# Patient Record
Sex: Female | Born: 1955 | Race: White | Hispanic: No | Marital: Single | Smoking: Former smoker
Health system: Southern US, Community
[De-identification: ages and names within clinical notes are randomized; demographics above are authoritative.]

## PROBLEM LIST (undated history)

## (undated) DIAGNOSIS — D649 Anemia, unspecified: Secondary | ICD-10-CM

## (undated) DIAGNOSIS — I82401 Acute embolism and thrombosis of unspecified deep veins of right lower extremity: Secondary | ICD-10-CM

## (undated) HISTORY — DX: Acute embolism and thrombosis of unspecified deep veins of right lower extremity: I82.401

## (undated) HISTORY — DX: Anemia, unspecified: D64.9

## (undated) HISTORY — PX: APPENDECTOMY: SHX54

---

## 1981-11-25 HISTORY — PX: DEBRIDEMENT AND CLOSURE WOUND: SHX5614

## 2004-09-19 ENCOUNTER — Ambulatory Visit: Payer: Self-pay | Admitting: Family Medicine

## 2009-03-14 ENCOUNTER — Ambulatory Visit: Payer: Self-pay | Admitting: Family Medicine

## 2010-11-25 DIAGNOSIS — I82401 Acute embolism and thrombosis of unspecified deep veins of right lower extremity: Secondary | ICD-10-CM

## 2010-11-25 HISTORY — DX: Acute embolism and thrombosis of unspecified deep veins of right lower extremity: I82.401

## 2011-02-05 ENCOUNTER — Inpatient Hospital Stay: Payer: Self-pay | Admitting: Internal Medicine

## 2011-04-17 ENCOUNTER — Ambulatory Visit: Payer: Self-pay | Admitting: Family Medicine

## 2014-01-13 ENCOUNTER — Emergency Department: Payer: Self-pay | Admitting: Emergency Medicine

## 2014-01-13 LAB — PROTIME-INR
INR: 2.2
PROTHROMBIN TIME: 24 s — AB (ref 11.5–14.7)

## 2014-02-28 ENCOUNTER — Ambulatory Visit: Payer: Self-pay | Admitting: Family Medicine

## 2014-03-24 DIAGNOSIS — Z86718 Personal history of other venous thrombosis and embolism: Secondary | ICD-10-CM | POA: Insufficient documentation

## 2014-08-29 DIAGNOSIS — R7303 Prediabetes: Secondary | ICD-10-CM | POA: Insufficient documentation

## 2016-12-31 NOTE — Progress Notes (Signed)
Hematology/Oncology Consult note [facility] Telephone:([phone]) [phone] [phone]) [phone]  No care team member to display   Name of the patient: Cynthia Shelton  [id number]  1955/11/26    Reason for referral- microcytic anemia   Referring physician- Dr. Burnadette PopLinthavong  Date of visit: 12/31/16   History of presenting illness- patient is a 61 year old female who is known to have chronic microcytic anemia. CBC from 12/24/2016 showed white count of 7, H&H of 7.5/27.9 with an MCV of 54.5 and platelet count of 425. Her ferritin was low at 2 and iron level was low at 12. Back in September 2015 her white count was 6.1, H&H was 11.7/37 with an MCV of 68.8 and a platelet count of 476. Patient has not been evaluated by GI. She has been hesitant to see them as she was concerned about bleeding during endoscopy as she is on coumadin. Patient has a history of DVT in 2012 and currently is on Coumadin. Patient states that her DVT was in her right lower extremity and was extensive. She apparently had genetic testing at that time which was negative. 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 Patient has not started taking oral iron yet. She denies any blood in stools or dark stools. Denies any frequent use of NSAIDS  ECOG PS- 1  Pain scale- 3- chronic neck pain   Review of systems- Review of Systems  Constitutional: Negative for chills, fever, malaise/fatigue and weight loss.  HENT: Negative for congestion, ear discharge and nosebleeds.   Eyes: Negative for blurred vision.  Respiratory: Negative for cough, hemoptysis, sputum production, shortness of breath and wheezing.   Cardiovascular: Negative for chest pain, palpitations, orthopnea and claudication.  Gastrointestinal: Negative for abdominal pain, blood in stool, constipation, diarrhea, heartburn, melena, nausea and vomiting.  Genitourinary: Negative for dysuria, flank pain, frequency, hematuria and urgency.    Musculoskeletal: Positive for joint pain. Negative for back pain and myalgias.  Skin: Negative for rash.  Neurological: Negative for dizziness, tingling, focal weakness, seizures, weakness and headaches.  Endo/Heme/Allergies: Does not bruise/bleed easily.  Psychiatric/Behavioral: Negative for depression and suicidal ideas. The patient does not have insomnia.     Allergies  Allergen Reactions  . Ciprofloxacin Other (See Comments)    Altered mental status  . Diazepam Other (See Comments)    Altered mental status  . Prednisone Other (See Comments)    Severe confusion  . Minocycline Other (See Comments) and Rash    thrush    Patient Active Problem List   Diagnosis Date Noted  . Microcytic anemia 01/02/2017  . DVT of lower extremity (deep venous thrombosis) (HCC) 01/02/2017     Past Medical History:  Diagnosis Date  . Anemia   . Right leg DVT (HCC) 2012     Past Surgical History:  Procedure Laterality Date  . APPENDECTOMY    . DEBRIDEMENT AND CLOSURE WOUND  1983    Social History   Social History  . Marital status: Unknown    Spouse name: N/A  . Number of children: N/A  . Years of education: N/A   Occupational History  . Not on file.   Social History Main Topics  . Smoking status: Current Every Day Smoker    Packs/day: 0.25    Years: 50.00    Types: Cigarettes  . Smokeless tobacco: Current User     Comment: pt refd info  . Alcohol use Not on file  . Drug use: Unknown  . Sexual activity:  Not on file   Other Topics Concern  . Not on file   Social History Narrative  . No narrative on file     Family History  Problem Relation Age of Onset  . Cirrhosis Mother   . Cancer Father      Current Outpatient Prescriptions:  .  acetaminophen (TYLENOL) 500 MG tablet, Take by mouth., Disp: , Rfl:  .  warfarin (COUMADIN) 5 MG tablet, take 2 tablets by mouth once daily EXCEPT 1 TABLET ON SUNDAYS, Disp: , Rfl: 0   Physical exam:  Vitals:   01/02/17 1357   BP: 138/89  Pulse: 84  Resp: 20  Temp: 98.7 F (37.1 C)  TempSrc: Tympanic  Weight: 233 lb 14.5 oz (106.1 kg)  Height: 5' 3.58" (1.615 m)   Physical Exam  Constitutional: She is oriented to person, place, and time.  Patient is obese, appears in no acute distress  HENT:  Head: Normocephalic and atraumatic.  Eyes: EOM are normal. Pupils are equal, round, and reactive to light.  Neck: Normal range of motion.  Cardiovascular: Normal rate, regular rhythm and normal heart sounds.   Pulmonary/Chest: Effort normal and breath sounds normal.  Abdominal: Soft. Bowel sounds are normal.  Neurological: She is alert and oriented to person, place, and time.  Skin: Skin is warm and dry.      Assessment and plan- Patient is a 61 y.o. female who has been referred to [place] for evaluation and management of microcytic anemia  1. Microcytic anemia secondary to iron deficiency. GI blood loss needs to be ruled out at this time. I explained to the patient that she has significant microcytic anemia and there is a concern for occult GI bleeding at this time. She continues to be on Coumadin and at present time her risk of bleeding is higher than the risk of clotting and I would favor holding her Coumadin for 2-3 weeks until she is evaluated by GI and gets endoscopy. Patient is concerned about developing a recurrent DVT or PE if she were to hold Coumadin. I explained to her that the decision to continue indefinite anticoagulation for unprovoked DVT is always the risks versus benefit decision based on risk of bleeding versus risk of clotting. We could consider putting in an interval IVC filter until she gets an endoscopy and hold her Coumadin. Patient understands the risks and benefits of continuing versus stopping Coumadin and would like to continue Coumadin until we have the results of today's blood work back. Today I will be checking a repeat CBC, ferritin, iron studies, B12 folate and reticulocyte count. I will refer  her to gastroenterology for panendoscopy. Patient states that she has to still straighten out her insurance and is concerned about out-of-pocket costs involved with her endoscopy. She is willing to see GI at this time but would like to hold off on endoscopy until she straightens her insurance out which would be fairly soon.  I will call the patient tomorrow with the results of her CBC and iron studies and if she were to be significantly anemic, I would definitely recommend holding Coumadin plus minus IVC filter until GI workup is completed.  Patient does have a higher degree of microcytosis than the level of her anemia. Even in September 2015 when her hemoglobin was 11.7 she was microcytic with an MCV of 68. Patient denies any family history of thalassemia. However I will check hemoglobin electrophoresis to rule out the same  If she were to be significantly anemic I  would favor giving her IV iron over oral iron. Patient would like to try oral iron before she goes for IV iron. She will start taking oral iron 325 mg twice a day and I will see her back in 6 weeks' time with repeat CBC and iron studies   Total face to face encounter time for this patient visit was 30 min. >50% of the time was  spent in counseling and coordination of care.    Thank you for this kind referral and the opportunity to participate in the care of this patient   Visit Diagnosis No diagnosis found.  Dr. Owens Shark, MD, MPH [facility] Pager- [phone] 12/31/2016

## 2017-01-02 ENCOUNTER — Inpatient Hospital Stay: Payer: Self-pay | Attending: Oncology | Admitting: Oncology

## 2017-01-02 ENCOUNTER — Encounter: Payer: Self-pay | Admitting: Oncology

## 2017-01-02 ENCOUNTER — Ambulatory Visit: Payer: Self-pay

## 2017-01-02 ENCOUNTER — Telehealth: Payer: Self-pay | Admitting: *Deleted

## 2017-01-02 ENCOUNTER — Encounter (INDEPENDENT_AMBULATORY_CARE_PROVIDER_SITE_OTHER): Payer: Self-pay

## 2017-01-02 VITALS — BP 138/89 | HR 84 | Temp 98.7°F | Resp 20 | Ht 63.58 in | Wt 233.9 lb

## 2017-01-02 DIAGNOSIS — F17210 Nicotine dependence, cigarettes, uncomplicated: Secondary | ICD-10-CM | POA: Insufficient documentation

## 2017-01-02 DIAGNOSIS — Z86718 Personal history of other venous thrombosis and embolism: Secondary | ICD-10-CM | POA: Insufficient documentation

## 2017-01-02 DIAGNOSIS — D509 Iron deficiency anemia, unspecified: Secondary | ICD-10-CM | POA: Insufficient documentation

## 2017-01-02 DIAGNOSIS — Z79899 Other long term (current) drug therapy: Secondary | ICD-10-CM | POA: Insufficient documentation

## 2017-01-02 DIAGNOSIS — Z7901 Long term (current) use of anticoagulants: Secondary | ICD-10-CM | POA: Insufficient documentation

## 2017-01-02 DIAGNOSIS — I825Y1 Chronic embolism and thrombosis of unspecified deep veins of right proximal lower extremity: Secondary | ICD-10-CM

## 2017-01-02 DIAGNOSIS — I82409 Acute embolism and thrombosis of unspecified deep veins of unspecified lower extremity: Secondary | ICD-10-CM | POA: Insufficient documentation

## 2017-01-02 LAB — CBC
HEMATOCRIT: 26.8 % — AB (ref 35.0–47.0)
HEMOGLOBIN: 7.7 g/dL — AB (ref 12.0–16.0)
MCH: 14.3 pg — AB (ref 26.0–34.0)
MCHC: 28.7 g/dL — AB (ref 32.0–36.0)
MCV: 49.9 fL — AB (ref 80.0–100.0)
Platelets: 409 10*3/uL (ref 150–440)
RBC: 5.38 MIL/uL — ABNORMAL HIGH (ref 3.80–5.20)
RDW: 22.2 % — AB (ref 11.5–14.5)
WBC: 7.2 10*3/uL (ref 3.6–11.0)

## 2017-01-02 LAB — IRON AND TIBC
IRON: 13 ug/dL — AB (ref 28–170)
SATURATION RATIOS: 2 % — AB (ref 10.4–31.8)
TIBC: 598 ug/dL — AB (ref 250–450)
UIBC: 585 ug/dL

## 2017-01-02 LAB — VITAMIN B12: VITAMIN B 12: 250 pg/mL (ref 180–914)

## 2017-01-02 LAB — FOLATE: Folate: 10.7 ng/mL (ref >5.9)

## 2017-01-02 LAB — RETICULOCYTES
RBC.: 5.38 MIL/uL — ABNORMAL HIGH (ref 3.80–5.20)
Retic Count, Absolute: 80.7 10*3/uL (ref 19.0–183.0)
Retic Ct Pct: 1.5 % (ref 0.4–3.1)

## 2017-01-02 LAB — FERRITIN: Ferritin: 2 ng/mL — ABNORMAL LOW (ref 11–307)

## 2017-01-02 NOTE — Telephone Encounter (Signed)
Pt contacted about her GI appt for March 15 at 9:30. Patient is agreeable to the appt. I did tell pt that her hgb 7.7, and ferritin was 2.  I told pt that based on levels that dr Smith Robertrao would rec: feraheme.  She states that she does not want IV iron. She is starting iron pills today and will take 1 tablet bid.  I told her if she changes her mind she can call and if not we will see her back and her next appt. I did tell her that I will let md know about gi appt not til march and pt wants to wait til then to see if ferritin gets better and if she wants to pursue this work up with gi

## 2017-01-02 NOTE — Progress Notes (Signed)
Pt here as new pt. Feeling very fatigued. C/o muscle aches that awaken her also from night .

## 2017-01-03 ENCOUNTER — Telehealth: Payer: Self-pay | Admitting: *Deleted

## 2017-01-03 NOTE — Telephone Encounter (Signed)
[facility]. She states she is having blood work in next 2 weeks and Dr. Smith Robertao wanted to add a cbc on because pt does not want to come to 2 different places. I asked if we could add it on and what I need to do. Nate states that he will send message to MD and if they won't do it they will call me. Otherwise it will be done and the labs is later this month.

## 2017-01-05 ENCOUNTER — Other Ambulatory Visit: Payer: Self-pay | Admitting: *Deleted

## 2017-01-05 DIAGNOSIS — D509 Iron deficiency anemia, unspecified: Secondary | ICD-10-CM

## 2017-01-06 LAB — HEMOGLOBINOPATHY EVALUATION
HGB A: 98.6 % (ref 96.4–98.8)
HGB C: 0 %
HGB S QUANTITAION: 0 %
Hgb A2 Quant: 1.4 % — ABNORMAL LOW (ref 1.8–3.2)
Hgb F Quant: 0 % (ref 0.0–2.0)
Hgb Variant: 0 %

## 2017-02-13 ENCOUNTER — Ambulatory Visit: Payer: Self-pay | Admitting: Oncology

## 2017-02-13 ENCOUNTER — Other Ambulatory Visit: Payer: Self-pay

## 2017-02-20 ENCOUNTER — Inpatient Hospital Stay: Payer: PRIVATE HEALTH INSURANCE

## 2017-02-20 ENCOUNTER — Inpatient Hospital Stay: Payer: PRIVATE HEALTH INSURANCE | Attending: Oncology | Admitting: Oncology

## 2017-02-20 ENCOUNTER — Telehealth: Payer: Self-pay | Admitting: Oncology

## 2017-02-20 ENCOUNTER — Other Ambulatory Visit: Payer: Self-pay | Admitting: Oncology

## 2017-02-20 VITALS — BP 140/81 | HR 81 | Temp 95.0°F | Resp 18 | Wt 235.3 lb

## 2017-02-20 DIAGNOSIS — D509 Iron deficiency anemia, unspecified: Secondary | ICD-10-CM

## 2017-02-20 DIAGNOSIS — Z7901 Long term (current) use of anticoagulants: Secondary | ICD-10-CM | POA: Insufficient documentation

## 2017-02-20 DIAGNOSIS — Z86718 Personal history of other venous thrombosis and embolism: Secondary | ICD-10-CM | POA: Diagnosis not present

## 2017-02-20 DIAGNOSIS — F17210 Nicotine dependence, cigarettes, uncomplicated: Secondary | ICD-10-CM

## 2017-02-20 DIAGNOSIS — Z79899 Other long term (current) drug therapy: Secondary | ICD-10-CM | POA: Diagnosis not present

## 2017-02-20 LAB — CBC
HCT: 28.1 % — ABNORMAL LOW (ref 35.0–47.0)
Hemoglobin: 8 g/dL — ABNORMAL LOW (ref 12.0–16.0)
MCH: 14.8 pg — AB (ref 26.0–34.0)
MCHC: 28.3 g/dL — AB (ref 32.0–36.0)
MCV: 52.1 fL — AB (ref 80.0–100.0)
PLATELETS: 383 10*3/uL (ref 150–440)
RBC: 5.4 MIL/uL — ABNORMAL HIGH (ref 3.80–5.20)
RDW: 23.6 % — AB (ref 11.5–14.5)
WBC: 6.3 10*3/uL (ref 3.6–11.0)

## 2017-02-20 LAB — FERRITIN: Ferritin: 1 ng/mL — ABNORMAL LOW (ref 11–307)

## 2017-02-20 LAB — IRON AND TIBC
Iron: 15 ug/dL — ABNORMAL LOW (ref 28–170)
Saturation Ratios: 3 % — ABNORMAL LOW (ref 10.4–31.8)
TIBC: 582 ug/dL — AB (ref 250–450)
UIBC: 567 ug/dL

## 2017-02-20 NOTE — Telephone Encounter (Signed)
Spoke with patient about iron and ferritin lab results. During office visit today, patient did not want IV iron and wanted to continue her oral iron BID. She wanted to wait until her granddaughter visited in late May. After giving results, she is now agreeable for two doses of IV iron.  We need pre-auth for iron transfusions and to schedule an appt for her to get her transfusions.   Thanks....Mauro KaufmannJennifer E Burns

## 2017-02-20 NOTE — Progress Notes (Signed)
Here for follow up. c/o constipation w iron x 10 d per pt.

## 2017-02-20 NOTE — Progress Notes (Signed)
Hematology/Oncology Consult note [facility]  Telephone:([phone] Fax:([phone]) [phone]  Patient Care Team: Dion Body, MD as PCP - General (Family Medicine)   Name of the patient: Cynthia Shelton  [id number]  1956-09-10   Date of visit: 02/20/17  Diagnosis- iron deficiency anemia. GI evaluation pending  Chief complaint/ Reason for visit- routine f/u  Heme/Onc history:  patient is a 61 year old female who is known to have chronic microcytic anemia. CBC from 12/24/2016 showed white count of 7, H&H of 7.5/27.9 with an MCV of 54.5 and platelet count of 425. Her ferritin was low at 2 and iron level was low at 12. Back in September 2015 her white count was 6.1, H&H was 11.7/37 with an MCV of 68.8 and a platelet count of 476. Patient has not been evaluated by GI. She has been hesitant to see them as she was concerned about bleeding during endoscopy as she is on coumadin. Patient has a history of DVT in 2012 and currently is on Coumadin. Patient states that her DVT was in her right lower extremity and was extensive. She apparently had genetic testing at that time which was negative. 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 Patient has not started taking oral iron yet. She denies any blood in stools or dark stools. Denies any frequent use of NSAIDS  Patient has refused IV iron in the past and has not followed up with gastroenterology yet   Interval history- report she is taking 1 dose of oral iron but has not been able to take 2 doses of oral iron due to constipation. She continues to be hesitant to try IV iron. She did touch base with [facility] but did not see them yet for possible endoscopy. Her granddaughter is visiting her in May and patient does not want to go through any endoscopy or colonoscopy until such time  ECOG PS- 1   Review of systems- Review of Systems  Constitutional: Negative for chills, fever, malaise/fatigue and  weight loss.  HENT: Negative for congestion, ear discharge and nosebleeds.   Eyes: Negative for blurred vision.  Respiratory: Negative for cough, hemoptysis, sputum production, shortness of breath and wheezing.   Cardiovascular: Negative for chest pain, palpitations, orthopnea and claudication.  Gastrointestinal: Negative for abdominal pain, blood in stool, constipation, diarrhea, heartburn, melena, nausea and vomiting.  Genitourinary: Negative for dysuria, flank pain, frequency, hematuria and urgency.  Musculoskeletal: Negative for back pain, joint pain and myalgias.  Skin: Negative for rash.  Neurological: Negative for dizziness, tingling, focal weakness, seizures, weakness and headaches.  Endo/Heme/Allergies: Does not bruise/bleed easily.  Psychiatric/Behavioral: Negative for depression and suicidal ideas. The patient does not have insomnia.      Current treatment- oral iron  Allergies  Allergen Reactions  . Ciprofloxacin Other (See Comments)    Altered mental status  . Diazepam Other (See Comments)    Altered mental status  . Prednisone Other (See Comments)    Severe confusion  . Minocycline Other (See Comments) and Rash    thrush     Past Medical History:  Diagnosis Date  . Anemia   . Right leg DVT ([place]) 2012     Past Surgical History:  Procedure Laterality Date  . APPENDECTOMY    . [place]    Social History   Social History  . Marital status: Unknown    Spouse name: N/A  . Number of children: N/A  . Years of  education: N/A   Occupational History  . Not on file.   Social History Main Topics  . Smoking status: Current Every Day Smoker    Packs/day: 0.25    Years: 50.00    Types: Cigarettes  . Smokeless tobacco: Current User     Comment: pt refd info  . Alcohol use Not on file  . Drug use: Unknown  . Sexual activity: Not on file   Other Topics Concern  . Not on file   Social History Narrative  . No narrative on file     Family History  Problem Relation Age of Onset  . Cirrhosis Mother   . Cancer Father      Current Outpatient Prescriptions:  .  acetaminophen (TYLENOL) 500 MG tablet, Take by mouth., Disp: , Rfl:  .  ferrous sulfate 325 (65 FE) MG tablet, Take 325 mg by mouth 2 (two) times daily with a meal., Disp: , Rfl:  .  warfarin (COUMADIN) 5 MG tablet, take 2 tablets by mouth once daily EXCEPT 1 TABLET ON SUNDAYS, Disp: , Rfl: 0  Physical exam:  Vitals:   02/20/17 1047  BP: 140/81  Pulse: 81  Resp: 18  Temp: (!) 95 F (35 C)  TempSrc: Tympanic  Weight: 235 lb 4.8 oz (106.7 kg)   Physical Exam  Constitutional: She is oriented to person, place, and time and well-developed, well-nourished, and in no distress.  HENT:  Head: Normocephalic and atraumatic.  Eyes: EOM are normal. Pupils are equal, round, and reactive to light.  Neck: Normal range of motion.  Cardiovascular: Normal rate, regular rhythm and normal heart sounds.   Pulmonary/Chest: Effort normal and breath sounds normal.  Abdominal: Soft. Bowel sounds are normal.  Neurological: She is alert and oriented to person, place, and time.  Skin: Skin is warm and dry.     No flowsheet data found. CBC Latest Ref Rng & Units 02/20/2017  WBC 3.6 - 11.0 K/uL 6.3  Hemoglobin 12.0 - 16.0 g/dL 8.0(L)  Hematocrit 35.0 - 47.0 % 28.1(L)  Platelets 150 - 440 K/uL 383    Assessment and plan- Patient is a 61 y.o. female who was referred to [place] for microcystic anemia  Patient continues to remain significantly anemic despite trial of oral iron and I strongly suggested that patient should try IV iron feraheme 510 mg X 2 doses at this time. Patient continues to be hesitant about trying IV iron and states that she would like to take oral iron twice a day. She is awaiting the results of iron studies from today and if there is persistently low she would be willing to consider IV iron. I did discuss the risks and benefits of IV iron including all but  not limited to risk of infusion reactions. Patient understands and would like to think about it. I also strongly suggested that patient needs to see gastroenterology given her iron deficiency anemia and back oral or IV iron and still does not ascertain the cause of the anemia and only treats it. I will see the patient back in 2 months time with repeat CBC and iron studies area and she will get a repeat CBC checked one month mark. Patient does have significant microcytic anemia and microcytosis has persisted in the past as well even when she was not this anemic. Hemoglobin electrophoresis did not reveal any evidence of thalassemia although alpha thalassemia trait candy mass in the presence of iron deficiency. The cause of this profound microcytosis is uncertain. I would  like to hold off on a bone marrow biopsy at this time  Given that her hemoglobin is low but stable around 8 she can continue with Coumadin at this time   Visit Diagnosis 1. Iron deficiency anemia, unspecified iron deficiency anemia type      Dr. Randa Evens, MD, MPH [facility] Pager- [phone] 02/20/2017 12:02 PM

## 2017-02-20 NOTE — Telephone Encounter (Signed)
Message sent to scheduling to schedule feraheme x2, once at the end of next week and the second one during the following week

## 2017-02-28 ENCOUNTER — Inpatient Hospital Stay: Payer: PRIVATE HEALTH INSURANCE | Attending: Oncology

## 2017-02-28 VITALS — BP 130/81 | HR 79 | Temp 97.5°F | Resp 16

## 2017-02-28 DIAGNOSIS — F17210 Nicotine dependence, cigarettes, uncomplicated: Secondary | ICD-10-CM | POA: Insufficient documentation

## 2017-02-28 DIAGNOSIS — D509 Iron deficiency anemia, unspecified: Secondary | ICD-10-CM | POA: Insufficient documentation

## 2017-02-28 DIAGNOSIS — Z79899 Other long term (current) drug therapy: Secondary | ICD-10-CM | POA: Insufficient documentation

## 2017-02-28 DIAGNOSIS — Z7901 Long term (current) use of anticoagulants: Secondary | ICD-10-CM | POA: Diagnosis not present

## 2017-02-28 DIAGNOSIS — Z86718 Personal history of other venous thrombosis and embolism: Secondary | ICD-10-CM | POA: Insufficient documentation

## 2017-02-28 MED ORDER — SODIUM CHLORIDE 0.9 % IV SOLN
510.0000 mg | Freq: Once | INTRAVENOUS | Status: AC
  Administered 2017-02-28: 510 mg via INTRAVENOUS
  Filled 2017-02-28: qty 17

## 2017-02-28 MED ORDER — SODIUM CHLORIDE 0.9 % IV SOLN
Freq: Once | INTRAVENOUS | Status: AC
  Administered 2017-02-28: 11:00:00 via INTRAVENOUS
  Filled 2017-02-28: qty 1000

## 2017-03-07 ENCOUNTER — Inpatient Hospital Stay: Payer: PRIVATE HEALTH INSURANCE

## 2017-03-07 VITALS — BP 115/81 | HR 70 | Temp 98.2°F | Resp 18

## 2017-03-07 DIAGNOSIS — D509 Iron deficiency anemia, unspecified: Secondary | ICD-10-CM | POA: Diagnosis not present

## 2017-03-07 MED ORDER — SODIUM CHLORIDE 0.9 % IV SOLN
Freq: Once | INTRAVENOUS | Status: AC
  Administered 2017-03-07: 13:00:00 via INTRAVENOUS
  Filled 2017-03-07: qty 1000

## 2017-03-07 MED ORDER — SODIUM CHLORIDE 0.9 % IV SOLN
510.0000 mg | Freq: Once | INTRAVENOUS | Status: AC
  Administered 2017-03-07: 510 mg via INTRAVENOUS
  Filled 2017-03-07: qty 17

## 2017-03-07 NOTE — Patient Instructions (Signed)

## 2017-03-24 ENCOUNTER — Other Ambulatory Visit: Payer: Self-pay

## 2017-03-24 ENCOUNTER — Inpatient Hospital Stay: Payer: PRIVATE HEALTH INSURANCE

## 2017-03-24 ENCOUNTER — Other Ambulatory Visit: Payer: Self-pay | Admitting: *Deleted

## 2017-03-24 DIAGNOSIS — D509 Iron deficiency anemia, unspecified: Secondary | ICD-10-CM

## 2017-03-24 LAB — CBC
HEMATOCRIT: 38.1 % (ref 35.0–47.0)
HEMOGLOBIN: 12.4 g/dL (ref 12.0–16.0)
MCH: 21.2 pg — AB (ref 26.0–34.0)
MCHC: 32.6 g/dL (ref 32.0–36.0)
MCV: 65 fL — ABNORMAL LOW (ref 80.0–100.0)
Platelets: 362 10*3/uL (ref 150–440)
RBC: 5.86 MIL/uL — AB (ref 3.80–5.20)
RDW: 39 % — ABNORMAL HIGH (ref 11.5–14.5)
WBC: 5.5 10*3/uL (ref 3.6–11.0)

## 2017-03-24 LAB — FERRITIN: FERRITIN: 25 ng/mL (ref 11–307)

## 2017-03-24 LAB — IRON AND TIBC
IRON: 44 ug/dL (ref 28–170)
Saturation Ratios: 10 % — ABNORMAL LOW (ref 10.4–31.8)
TIBC: 435 ug/dL (ref 250–450)
UIBC: 391 ug/dL

## 2017-04-29 ENCOUNTER — Other Ambulatory Visit: Payer: Self-pay | Admitting: *Deleted

## 2017-04-29 ENCOUNTER — Inpatient Hospital Stay: Payer: No Typology Code available for payment source | Admitting: Oncology

## 2017-04-29 ENCOUNTER — Inpatient Hospital Stay: Payer: PRIVATE HEALTH INSURANCE | Attending: Oncology

## 2017-04-29 DIAGNOSIS — D509 Iron deficiency anemia, unspecified: Secondary | ICD-10-CM

## 2017-04-29 DIAGNOSIS — Z79899 Other long term (current) drug therapy: Secondary | ICD-10-CM | POA: Insufficient documentation

## 2017-04-29 DIAGNOSIS — Z7901 Long term (current) use of anticoagulants: Secondary | ICD-10-CM | POA: Insufficient documentation

## 2017-04-29 DIAGNOSIS — Z86718 Personal history of other venous thrombosis and embolism: Secondary | ICD-10-CM | POA: Insufficient documentation

## 2017-04-29 DIAGNOSIS — F17210 Nicotine dependence, cigarettes, uncomplicated: Secondary | ICD-10-CM | POA: Insufficient documentation

## 2017-04-29 LAB — CBC
HCT: 38.7 % (ref 35.0–47.0)
Hemoglobin: 13.1 g/dL (ref 12.0–16.0)
MCH: 23.7 pg — ABNORMAL LOW (ref 26.0–34.0)
MCHC: 33.8 g/dL (ref 32.0–36.0)
MCV: 70.1 fL — ABNORMAL LOW (ref 80.0–100.0)
PLATELETS: 302 10*3/uL (ref 150–440)
RBC: 5.52 MIL/uL — ABNORMAL HIGH (ref 3.80–5.20)
RDW: 32.6 % — AB (ref 11.5–14.5)
WBC: 5.3 10*3/uL (ref 3.6–11.0)

## 2017-04-29 LAB — IRON AND TIBC
IRON: 37 ug/dL (ref 28–170)
Saturation Ratios: 8 % — ABNORMAL LOW (ref 10.4–31.8)
TIBC: 456 ug/dL — AB (ref 250–450)
UIBC: 419 ug/dL

## 2017-04-29 LAB — FERRITIN: FERRITIN: 16 ng/mL (ref 11–307)

## 2017-04-30 ENCOUNTER — Other Ambulatory Visit: Payer: Self-pay | Admitting: *Deleted

## 2017-04-30 ENCOUNTER — Telehealth: Payer: Self-pay | Admitting: *Deleted

## 2017-04-30 DIAGNOSIS — D509 Iron deficiency anemia, unspecified: Secondary | ICD-10-CM

## 2017-04-30 NOTE — Telephone Encounter (Signed)
Called pt and let her know that ferritin is 16, and iron is 37.  She rec: to hold feraheme now and cont her oral iron pills.  She states that she tries to take it twice a day but most of the time she only gets one .  I told her it is important to try 2 times a day and she says that with her being on coumadin she understands it affects the ferritin. She does not want to take feraheme unless necessary because her insurance does not pay for it. But she does not want to stop her coumadin and risk stroke. She will try to do better with iron pills. She is agreeable to an appt with labs and see md in 2 months with poss. Feraheme. It will need to be broke up into lab one day and md and treatment another day.  She is agreeable to that. So first week of august a tues am for labs and Thursday pm for see md and feraheme in afternoon . I will send message to schedule this and to have them call pt with appt

## 2017-04-30 NOTE — Telephone Encounter (Signed)
-----   Message from Creig HinesArchana C Rao, MD sent at 04/30/2017 12:28 PM EDT ----- Will hold IV iron for now as she is no anemic but still iron deficient. Continue oral iron. Repeat cbc and iron studies in 2 months and see me at that point, schedule her possible feraheme that day

## 2017-05-22 ENCOUNTER — Encounter: Payer: Self-pay | Admitting: Family Medicine

## 2017-06-24 DIAGNOSIS — Z7185 Encounter for immunization safety counseling: Secondary | ICD-10-CM | POA: Insufficient documentation

## 2017-06-30 ENCOUNTER — Telehealth: Payer: Self-pay | Admitting: *Deleted

## 2017-06-30 NOTE — Telephone Encounter (Signed)
Opened in error

## 2017-07-01 ENCOUNTER — Inpatient Hospital Stay: Payer: PRIVATE HEALTH INSURANCE

## 2017-07-03 ENCOUNTER — Inpatient Hospital Stay: Payer: PRIVATE HEALTH INSURANCE

## 2017-07-03 ENCOUNTER — Inpatient Hospital Stay: Payer: PRIVATE HEALTH INSURANCE | Admitting: Oncology

## 2017-07-23 ENCOUNTER — Inpatient Hospital Stay: Payer: PRIVATE HEALTH INSURANCE

## 2017-07-25 ENCOUNTER — Inpatient Hospital Stay: Payer: PRIVATE HEALTH INSURANCE

## 2017-07-25 ENCOUNTER — Inpatient Hospital Stay: Payer: PRIVATE HEALTH INSURANCE | Admitting: Oncology

## 2017-07-30 ENCOUNTER — Inpatient Hospital Stay: Payer: Self-pay

## 2017-07-30 DIAGNOSIS — D509 Iron deficiency anemia, unspecified: Secondary | ICD-10-CM | POA: Insufficient documentation

## 2017-07-30 DIAGNOSIS — Z86718 Personal history of other venous thrombosis and embolism: Secondary | ICD-10-CM | POA: Insufficient documentation

## 2017-07-30 DIAGNOSIS — Z7901 Long term (current) use of anticoagulants: Secondary | ICD-10-CM | POA: Insufficient documentation

## 2017-07-30 DIAGNOSIS — F17210 Nicotine dependence, cigarettes, uncomplicated: Secondary | ICD-10-CM | POA: Insufficient documentation

## 2017-07-30 LAB — FERRITIN: Ferritin: 18 ng/mL (ref 11–307)

## 2017-07-30 LAB — CBC
HCT: 38.1 % (ref 35.0–47.0)
HEMOGLOBIN: 13 g/dL (ref 12.0–16.0)
MCH: 27.8 pg (ref 26.0–34.0)
MCHC: 34.1 g/dL (ref 32.0–36.0)
MCV: 81.3 fL (ref 80.0–100.0)
PLATELETS: 302 10*3/uL (ref 150–440)
RBC: 4.69 MIL/uL (ref 3.80–5.20)
RDW: 17.1 % — ABNORMAL HIGH (ref 11.5–14.5)
WBC: 5.2 10*3/uL (ref 3.6–11.0)

## 2017-07-30 LAB — IRON AND TIBC
IRON: 110 ug/dL (ref 28–170)
Saturation Ratios: 26 % (ref 10.4–31.8)
TIBC: 418 ug/dL (ref 250–450)
UIBC: 308 ug/dL

## 2017-07-31 ENCOUNTER — Telehealth: Payer: Self-pay | Admitting: *Deleted

## 2017-07-31 NOTE — Telephone Encounter (Signed)
-----   Message from Creig HinesArchana C Rao, MD sent at 07/31/2017  8:19 AM EDT ----- Ferritin still low but other iron studies normal. We could give her 1 more feraheme for that if she is agreeable

## 2017-07-31 NOTE — Telephone Encounter (Signed)
Patient called and states her phone is out of order and to call her on [phone] for the next several weeks if needed

## 2017-07-31 NOTE — Telephone Encounter (Signed)
I tried calling pt but voicemail is full.  I sent her an email giving her the info that ferritin is low normal and Dr. Smith Robertao rec: 1 more dose of IV iron and asked pt to call me back if she would like to get another dose .

## 2017-08-01 ENCOUNTER — Inpatient Hospital Stay: Payer: Self-pay

## 2017-08-01 ENCOUNTER — Encounter: Payer: Self-pay | Admitting: Oncology

## 2017-08-01 ENCOUNTER — Inpatient Hospital Stay: Payer: Self-pay | Attending: Oncology | Admitting: Oncology

## 2017-08-01 VITALS — BP 134/84 | HR 99 | Temp 98.0°F | Resp 20 | Wt 230.4 lb

## 2017-08-01 DIAGNOSIS — F17210 Nicotine dependence, cigarettes, uncomplicated: Secondary | ICD-10-CM

## 2017-08-01 DIAGNOSIS — D509 Iron deficiency anemia, unspecified: Secondary | ICD-10-CM

## 2017-08-01 DIAGNOSIS — Z7901 Long term (current) use of anticoagulants: Secondary | ICD-10-CM

## 2017-08-01 DIAGNOSIS — Z86718 Personal history of other venous thrombosis and embolism: Secondary | ICD-10-CM

## 2017-08-01 DIAGNOSIS — I825Y1 Chronic embolism and thrombosis of unspecified deep veins of right proximal lower extremity: Secondary | ICD-10-CM

## 2017-08-01 MED ORDER — SODIUM CHLORIDE 0.9 % IV SOLN
Freq: Once | INTRAVENOUS | Status: AC
  Administered 2017-08-01: 14:00:00 via INTRAVENOUS
  Filled 2017-08-01: qty 1000

## 2017-08-01 MED ORDER — SODIUM CHLORIDE 0.9 % IV SOLN
510.0000 mg | Freq: Once | INTRAVENOUS | Status: AC
  Administered 2017-08-01: 510 mg via INTRAVENOUS
  Filled 2017-08-01: qty 17

## 2017-08-01 NOTE — Progress Notes (Signed)
Hematology/Oncology Consult note [facility]  Telephone:([phone] [phone]  Patient Care Team: Marisue Ivan, MD as PCP - General (Family Medicine)   Name of the patient: Cynthia Shelton  [id number]  1956-03-25   Date of visit: 08/01/17  Diagnosis- iron deficiency anemia  Chief complaint/ Reason for visit- routine f/u  Heme/Onc history: patient is a 61 year old female who is known to have chronic microcytic anemia. CBC from 12/24/2016 showed white count of 7, H&H of 7.5/27.9 with an MCV of 54.5 and platelet count of 425. Her ferritin was low at 2 and iron level was low at 12. Back in September 2015 her white count was 6.1, H&H was 11.7/37 with an MCV of 68.8 and a platelet count of 476. Patient has not been evaluated by GI. She has been hesitant to see them as she was concerned about bleeding during endoscopy as she is on coumadin. Patient has a history of DVT in 2012and currently is on Coumadin.Patient states that her DVT was in her right lower extremity and was extensive. She apparently had genetic testing at that time which was negative. 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 Patient has not started taking oral iron yet. She denies any blood in stools or dark stools. Denies any frequent use of NSAIDS  Patient initially did not want to take IV iron. After no improvement in her H/H she agreed  Interval history- she has not seen GI due to insurance issues. Denies any bleeding ins tool or urine. Feels fatigued  ECOG PS- 1 Pain scale- 0   Review of systems- Review of Systems  Constitutional: Positive for malaise/fatigue. Negative for chills, fever and weight loss.  HENT: Negative for congestion, ear discharge and nosebleeds.   Eyes: Negative for blurred vision.  Respiratory: Negative for cough, hemoptysis, sputum production, shortness of breath and wheezing.   Cardiovascular: Negative for chest pain, palpitations,  orthopnea and claudication.  Gastrointestinal: Negative for abdominal pain, blood in stool, constipation, diarrhea, heartburn, melena, nausea and vomiting.  Genitourinary: Negative for dysuria, flank pain, frequency, hematuria and urgency.  Musculoskeletal: Negative for back pain, joint pain and myalgias.  Skin: Negative for rash.  Neurological: Negative for dizziness, tingling, focal weakness, seizures, weakness and headaches.  Endo/Heme/Allergies: Does not bruise/bleed easily.  Psychiatric/Behavioral: Negative for depression and suicidal ideas. The patient does not have insomnia.       Allergies  Allergen Reactions  . Ciprofloxacin Other (See Comments)    Altered mental status  . Diazepam Other (See Comments)    Altered mental status  . Prednisone Other (See Comments)    Severe confusion  . Minocycline Other (See Comments) and Rash    thrush     Past Medical History:  Diagnosis Date  . Anemia   . Right leg DVT (HCC) 2012     Past Surgical History:  Procedure Laterality Date  . APPENDECTOMY    . DEBRIDEMENT AND CLOSURE WOUND  1983    Social History   Social History  . Marital status: Unknown    Spouse name: N/A  . Number of children: N/A  . Years of education: N/A   Occupational History  . Not on file.   Social History Main Topics  . Smoking status: Current Every Day Smoker    Packs/day: 0.25    Years: 50.00    Types: Cigarettes  . Smokeless tobacco: Current User     Comment: pt refd info  . Alcohol use  Not on file  . Drug use: Unknown  . Sexual activity: Not on file   Other Topics Concern  . Not on file   Social History Narrative  . No narrative on file    Family History  Problem Relation Age of Onset  . Cirrhosis Mother   . Cancer Father      Current Outpatient Prescriptions:  .  acetaminophen (TYLENOL) 500 MG tablet, Take by mouth., Disp: , Rfl:  .  ferrous sulfate 325 (65 FE) MG tablet, Take 325 mg by mouth 2 (two) times daily with a  meal., Disp: , Rfl:  .  warfarin (COUMADIN) 5 MG tablet, take 2 tablets by mouth once daily EXCEPT 1 TABLET ON SUNDAYS, Disp: , Rfl: 0  Physical exam:  Vitals:   08/01/17 1334  BP: 134/84  Pulse: 99  Resp: 20  Temp: 98 F (36.7 C)  TempSrc: Tympanic  Weight: 230 lb 6.4 oz (104.5 kg)   Physical Exam  Constitutional: She is oriented to person, place, and time.  Obese, appears in no acute distress  HENT:  Head: Normocephalic and atraumatic.  Eyes: Pupils are equal, round, and reactive to light. EOM are normal.  Neck: Normal range of motion.  Cardiovascular: Normal rate, regular rhythm and normal heart sounds.   Pulmonary/Chest: Effort normal and breath sounds normal.  Abdominal: Soft. Bowel sounds are normal.  Neurological: She is alert and oriented to person, place, and time.  Skin: Skin is warm and dry.     No flowsheet data found. CBC Latest Ref Rng & Units 07/30/2017  WBC 3.6 - 11.0 K/uL 5.2  Hemoglobin 12.0 - 16.0 g/dL 16.113.0  Hematocrit 09.635.0 - 47.0 % 38.1  Platelets 150 - 440 K/uL 302     Assessment and plan- Patient is a 61 y.o. female with iron deficiency anemia  IDA- H/h now normal after 2 does of feraheme. Iron studies show low ferritin of 18. I have recommended 1 more dose of feraheme. I have again encouraged her to f/u with GI which she declines at this time due to insurance issues and out of pocket pay. Repeat cbc ferritin and iron studies in 3 and 6 months and I will see her in 6 months  RLE DVT in 2012- seemingly unprovoked. Also reports it was extensive RLE DVT. She is currently on coumadin but wonders if she can come off it. Her risk factors for recurrent DVT include prior unprovoked DVT, obesity and smoking. But given her iron deficiency anemia which was severe few months ago and has not been evaluated by GI, she is at a risk of bleeding too. It would be reasonable to come off coumadin at this time as long as she understands the risk of recurrenrt DVt/PE. We could  check d dimer few weeks after stopping coumadin and if it remains elevated that may indicate increased risk of thromboses. Patient would like to think about this and get back to us in 2 weeks time   Visit Diagnosis 1. Iron deficiency anemia, unspecified iron deficiency anemia type   2. 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 Owens SharkArchana Joanthan Hlavacek, MD, MPH [facility] at [facility] Pager- [phone] 08/01/2017 2:08 PM

## 2017-08-01 NOTE — Progress Notes (Signed)
Here for follow up

## 2017-08-01 NOTE — Addendum Note (Signed)
Addended by: Owens SharkAO, Shaquasia Caponigro C on: 08/01/2017 02:19 PM   Modules accepted: Orders

## 2017-08-01 NOTE — Telephone Encounter (Signed)
Thank you. She came in today and she should get her f/u appt before she leaves after her iron infusion.

## 2017-10-31 ENCOUNTER — Inpatient Hospital Stay: Payer: Self-pay | Attending: Oncology

## 2017-10-31 DIAGNOSIS — Z7901 Long term (current) use of anticoagulants: Secondary | ICD-10-CM | POA: Insufficient documentation

## 2017-10-31 DIAGNOSIS — D509 Iron deficiency anemia, unspecified: Secondary | ICD-10-CM | POA: Insufficient documentation

## 2017-10-31 DIAGNOSIS — F17210 Nicotine dependence, cigarettes, uncomplicated: Secondary | ICD-10-CM | POA: Insufficient documentation

## 2017-10-31 DIAGNOSIS — Z86718 Personal history of other venous thrombosis and embolism: Secondary | ICD-10-CM | POA: Insufficient documentation

## 2017-10-31 LAB — CBC WITH DIFFERENTIAL/PLATELET
BASOS PCT: 1 %
Basophils Absolute: 0.1 10*3/uL (ref 0–0.1)
Eosinophils Absolute: 0.3 10*3/uL (ref 0–0.7)
Eosinophils Relative: 5 %
HEMATOCRIT: 45.1 % (ref 35.0–47.0)
Hemoglobin: 15 g/dL (ref 12.0–16.0)
Lymphocytes Relative: 27 %
Lymphs Abs: 1.5 10*3/uL (ref 1.0–3.6)
MCH: 28 pg (ref 26.0–34.0)
MCHC: 33.2 g/dL (ref 32.0–36.0)
MCV: 84.3 fL (ref 80.0–100.0)
MONO ABS: 0.5 10*3/uL (ref 0.2–0.9)
MONOS PCT: 9 %
NEUTROS ABS: 3.1 10*3/uL (ref 1.4–6.5)
Neutrophils Relative %: 58 %
Platelets: 292 10*3/uL (ref 150–440)
RBC: 5.35 MIL/uL — ABNORMAL HIGH (ref 3.80–5.20)
RDW: 14.3 % (ref 11.5–14.5)
WBC: 5.4 10*3/uL (ref 3.6–11.0)

## 2017-10-31 LAB — IRON AND TIBC
IRON: 79 ug/dL (ref 28–170)
SATURATION RATIOS: 21 % (ref 10.4–31.8)
TIBC: 377 ug/dL (ref 250–450)
UIBC: 298 ug/dL

## 2017-10-31 LAB — FERRITIN: Ferritin: 36 ng/mL (ref 11–307)

## 2017-11-06 ENCOUNTER — Telehealth: Payer: Self-pay | Admitting: *Deleted

## 2017-11-06 NOTE — Telephone Encounter (Signed)
She is not anemic and iron studies are normal

## 2017-11-06 NOTE — Telephone Encounter (Signed)
Erroneous encounter

## 2017-11-06 NOTE — Telephone Encounter (Signed)
Patient called requesting lab results from her last appt.

## 2017-11-06 NOTE — Telephone Encounter (Signed)
Left message on patient's VM that she is not anemic and her iron studies are normal, per Dr. Smith Robertao.     dhs

## 2018-01-30 ENCOUNTER — Inpatient Hospital Stay: Payer: Self-pay

## 2018-01-30 ENCOUNTER — Inpatient Hospital Stay: Payer: Self-pay | Admitting: Oncology

## 2018-02-17 ENCOUNTER — Inpatient Hospital Stay (HOSPITAL_BASED_OUTPATIENT_CLINIC_OR_DEPARTMENT_OTHER): Payer: BLUE CROSS/BLUE SHIELD | Admitting: Oncology

## 2018-02-17 ENCOUNTER — Encounter: Payer: Self-pay | Admitting: Oncology

## 2018-02-17 ENCOUNTER — Inpatient Hospital Stay: Payer: BLUE CROSS/BLUE SHIELD | Attending: Oncology

## 2018-02-17 VITALS — BP 102/69 | HR 76 | Temp 97.3°F | Resp 18 | Ht 63.58 in | Wt 224.2 lb

## 2018-02-17 DIAGNOSIS — D509 Iron deficiency anemia, unspecified: Secondary | ICD-10-CM

## 2018-02-17 DIAGNOSIS — Z86718 Personal history of other venous thrombosis and embolism: Secondary | ICD-10-CM | POA: Diagnosis not present

## 2018-02-17 DIAGNOSIS — Z7901 Long term (current) use of anticoagulants: Secondary | ICD-10-CM | POA: Insufficient documentation

## 2018-02-17 DIAGNOSIS — Z79899 Other long term (current) drug therapy: Secondary | ICD-10-CM | POA: Diagnosis not present

## 2018-02-17 DIAGNOSIS — I825Y1 Chronic embolism and thrombosis of unspecified deep veins of right proximal lower extremity: Secondary | ICD-10-CM

## 2018-02-17 DIAGNOSIS — F17210 Nicotine dependence, cigarettes, uncomplicated: Secondary | ICD-10-CM | POA: Insufficient documentation

## 2018-02-17 DIAGNOSIS — D508 Other iron deficiency anemias: Secondary | ICD-10-CM

## 2018-02-17 LAB — CBC WITH DIFFERENTIAL/PLATELET
BASOS ABS: 0.1 10*3/uL (ref 0–0.1)
Basophils Relative: 1 %
Eosinophils Absolute: 0.4 10*3/uL (ref 0–0.7)
Eosinophils Relative: 7 %
HCT: 46 % (ref 35.0–47.0)
Hemoglobin: 15.4 g/dL (ref 12.0–16.0)
LYMPHS ABS: 1.7 10*3/uL (ref 1.0–3.6)
LYMPHS PCT: 26 %
MCH: 28.2 pg (ref 26.0–34.0)
MCHC: 33.4 g/dL (ref 32.0–36.0)
MCV: 84.3 fL (ref 80.0–100.0)
MONO ABS: 0.5 10*3/uL (ref 0.2–0.9)
Monocytes Relative: 8 %
NEUTROS ABS: 3.6 10*3/uL (ref 1.4–6.5)
Neutrophils Relative %: 58 %
Platelets: 309 10*3/uL (ref 150–440)
RBC: 5.45 MIL/uL — AB (ref 3.80–5.20)
RDW: 14.7 % — ABNORMAL HIGH (ref 11.5–14.5)
WBC: 6.3 10*3/uL (ref 3.6–11.0)

## 2018-02-17 LAB — IRON AND TIBC
Iron: 92 ug/dL (ref 28–170)
SATURATION RATIOS: 22 % (ref 10.4–31.8)
TIBC: 426 ug/dL (ref 250–450)
UIBC: 334 ug/dL

## 2018-02-17 LAB — FERRITIN: FERRITIN: 51 ng/mL (ref 11–307)

## 2018-02-17 NOTE — Progress Notes (Signed)
No new changes noted today 

## 2018-02-17 NOTE — Progress Notes (Signed)
Hematology/Oncology Consult note [facility]  Telephone:([phone]) [phone] Fax:([phone]  Patient Care Team: Marisue IvanLinthavong, Kanhka, MD as PCP - General (Family Medicine)   Name of the patient: Cynthia Shelton  [id number]  [birth year]   Date of visit: 02/17/18  Diagnosis- iron deficiency anemia  Chief complaint/ Reason for visit- routine f/u of iron deficiency anemia  Heme/Onc history: patient is a 62 year old female who is known to have chronic microcytic anemia. CBC from 12/24/2016 showed white count of 7, H&H of 7.5/27.9 with an MCV of 54.5 and platelet count of 425. Her ferritin was low at 2 and iron level was low at 12. Back in September 2015 her white count was 6.1, H&H was 11.7/37 with an MCV of 68.8 and a platelet count of 476. Patient has not been evaluated by GI. She has been hesitant to see them as she was concerned about bleeding during endoscopy as she is on coumadin. Patient has a history of DVT in 2012and currently is on Coumadin.Patient states that her DVT was in her right lower extremity and was extensive. She apparently had genetic testing at that time which was negative. 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 Patient has not started taking oral iron yet. She denies any blood in stools or dark stools. Denies any frequent use of NSAIDS  Patient initially did not want to take IV iron. After no improvement in her H/H she agreed. Last dose of feraheme was in sept 2018. Patient declined GI intervention   Interval history- she has been off blood thinners for last 6 months. She has not seen GI yet. She is taking oral iron once daily. Denies any blood in stool or urine. Denies any black melanotic stools. She is working on her weight loss  ECOG PS- 1 Pain scale- 0   Review of systems- Review of Systems  Constitutional: Positive for malaise/fatigue. Negative for chills, fever and weight loss.  HENT: Negative for congestion,  ear discharge and nosebleeds.   Eyes: Negative for blurred vision.  Respiratory: Negative for cough, hemoptysis, sputum production, shortness of breath and wheezing.   Cardiovascular: Negative for chest pain, palpitations, orthopnea and claudication.  Gastrointestinal: Negative for abdominal pain, blood in stool, constipation, diarrhea, heartburn, melena, nausea and vomiting.  Genitourinary: Negative for dysuria, flank pain, frequency, hematuria and urgency.  Musculoskeletal: Negative for back pain, joint pain and myalgias.  Skin: Negative for rash.  Neurological: Negative for dizziness, tingling, focal weakness, seizures, weakness and headaches.  Endo/Heme/Allergies: Does not bruise/bleed easily.  Psychiatric/Behavioral: Negative for depression and suicidal ideas. The patient does not have insomnia.      Allergies  Allergen Reactions  . Ciprofloxacin Other (See Comments)    Altered mental status  . Diazepam Other (See Comments)    Altered mental status  . Prednisone Other (See Comments)    Severe confusion  . Minocycline Other (See Comments) and Rash    thrush     Past Medical History:  Diagnosis Date  . Anemia   . Right leg DVT (HCC) 2012     Past Surgical History:  Procedure Laterality Date  . APPENDECTOMY    . DEBRIDEMENT AND CLOSURE WOUND  1983    Social History   Socioeconomic History  . Marital status: Unknown    Spouse name: Not on file  . Number of children: Not on file  . Years of education: Not on file  . Highest education level: Not on file  Occupational  History  . Not on file  Social Needs  . Financial resource strain: Not on file  . Food insecurity:    Worry: Not on file    Inability: Not on file  . Transportation needs:    Medical: Not on file    Non-medical: Not on file  Tobacco Use  . Smoking status: Current Every Day Smoker    Packs/day: 0.25    Years: 50.00    Pack years: 12.50    Types: Cigarettes  . Smokeless tobacco: Current User    . Tobacco comment: pt refd info  Substance and Sexual Activity  . Alcohol use: Not on file  . Drug use: Not on file  . Sexual activity: Not on file  Lifestyle  . Physical activity:    Days per week: Not on file    Minutes per session: Not on file  . Stress: Not on file  Relationships  . Social connections:    Talks on phone: Not on file    Gets together: Not on file    Attends religious service: Not on file    Active member of club or organization: Not on file    Attends meetings of clubs or organizations: Not on file    Relationship status: Not on file  . Intimate partner violence:    Fear of current or ex partner: Not on file    Emotionally abused: Not on file    Physically abused: Not on file    Forced sexual activity: Not on file  Other Topics Concern  . Not on file  Social History Narrative  . Not on file    Family History  Problem Relation Age of Onset  . Cirrhosis Mother   . Cancer Father      Current Outpatient Medications:  .  acetaminophen (TYLENOL) 500 MG tablet, Take by mouth., Disp: , Rfl:  .  ferrous sulfate 325 (65 FE) MG tablet, Take 325 mg by mouth 2 (two) times daily with a meal., Disp: , Rfl:  .  warfarin (COUMADIN) 5 MG tablet, take 2 tablets by mouth once daily EXCEPT 1 TABLET ON SUNDAYS, Disp: , Rfl: 0  Physical exam:  Vitals:   02/17/18 0949  BP: 102/69  Pulse: 76  Resp: 18  Temp: (!) 97.3 F (36.3 C)  TempSrc: Tympanic  SpO2: 97%  Weight: 224 lb 3.2 oz (101.7 kg)  Height: 5' 3.58" (1.615 m)   Physical Exam  Constitutional: She is oriented to person, place, and time.  Patient is obese.  Does not appear to be in any acute distress  HENT:  Head: Normocephalic and atraumatic.  Eyes: Pupils are equal, round, and reactive to light. EOM are normal.  Neck: Normal range of motion.  Cardiovascular: Normal rate, regular rhythm and normal heart sounds.  Pulmonary/Chest: Effort normal and breath sounds normal.  Abdominal: Soft. Bowel sounds  are normal.  Neurological: She is alert and oriented to person, place, and time.  Skin: Skin is warm and dry.     No flowsheet data found. CBC Latest Ref Rng & Units 02/17/2018  WBC 3.6 - 11.0 K/uL 6.3  Hemoglobin 12.0 - 16.0 g/dL 11.9  Hematocrit 14.7 - 47.0 % 46.0  Platelets 150 - 440 K/uL 309      Assessment and plan- Patient is a 62 y.o. female with following issues:  1. Iron deficiency anemia: Hemoglobin has been stable around 15 for the last 3 months.  Iron studies from today are pending.  I  doubt that she would be low in her iron but if iron levels come back low give her a call.  Repeat CBC and ferritin and iron studies in 3 and 6 months and I will see her back in 6 months.  I have again encouraged her to see GI and she will think about it.  I have asked her to stop her iron tablets which she has been taking once a day to see if she develops iron deficiency of iron pills.  2. RLE DVT in 2012: Risks and benefits of continuing versus stopping anticoagulation was discussed with the patient in the past. She has decided to come off blood thinners and has been off Coumadin for the last 6 months   Visit Diagnosis 1. Other iron deficiency anemia   2. Microcytic anemia   3. 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 Owens Shark, MD, MPH [facility] at [facility] Pager- [phone] 02/17/2018 11:42 AM

## 2018-05-19 ENCOUNTER — Inpatient Hospital Stay: Payer: BLUE CROSS/BLUE SHIELD

## 2018-05-26 ENCOUNTER — Inpatient Hospital Stay: Payer: BLUE CROSS/BLUE SHIELD | Attending: Oncology

## 2018-05-26 DIAGNOSIS — D509 Iron deficiency anemia, unspecified: Secondary | ICD-10-CM | POA: Diagnosis present

## 2018-05-26 DIAGNOSIS — D508 Other iron deficiency anemias: Secondary | ICD-10-CM

## 2018-05-26 LAB — CBC
HEMATOCRIT: 44.1 % (ref 35.0–47.0)
Hemoglobin: 15 g/dL (ref 12.0–16.0)
MCH: 28.2 pg (ref 26.0–34.0)
MCHC: 34 g/dL (ref 32.0–36.0)
MCV: 82.9 fL (ref 80.0–100.0)
PLATELETS: 328 10*3/uL (ref 150–440)
RBC: 5.32 MIL/uL — AB (ref 3.80–5.20)
RDW: 14.7 % — ABNORMAL HIGH (ref 11.5–14.5)
WBC: 6.2 10*3/uL (ref 3.6–11.0)

## 2018-05-26 LAB — FERRITIN: Ferritin: 34 ng/mL (ref 11–307)

## 2018-05-26 LAB — IRON AND TIBC
Iron: 88 ug/dL (ref 28–170)
Saturation Ratios: 22 % (ref 10.4–31.8)
TIBC: 394 ug/dL (ref 250–450)
UIBC: 306 ug/dL

## 2018-06-01 ENCOUNTER — Telehealth: Payer: Self-pay | Admitting: *Deleted

## 2018-06-01 NOTE — Telephone Encounter (Signed)
She is not anemic. Ferritin is low at 34. We can give her 1 dose of feraheme if she is willing. Thanks, Ovidio KinArchana

## 2018-06-01 NOTE — Telephone Encounter (Signed)
Patient states she is not symptomatic right now and prefers to wait 3 months to be rechecked unless she becomes symptomatic, then she will call. Confirmed appointment for 08/18/18 @ 215

## 2018-06-01 NOTE — Telephone Encounter (Signed)
   Ref Range & Units 6d ago 29mo ago  Ferritin 11 - 307 ng/mL 34  51 CM  Comment: Performed at [facility], [address]          Ref Range & Units 6d ago  Iron 28 - 170 ug/dL 88   TIBC 161250 - 096450 ug/dL 045394   Saturation Ratios 10.4 - 31.8 % 22   UIBC ug/dL 409306          Ref Range & Units 6d ago  WBC 3.6 - 11.0 K/uL 6.2   RBC 3.80 - 5.20 MIL/uL 5.32High    Hemoglobin 12.0 - 16.0 g/dL 81.115.0   HCT 91.435.0 - 78.247.0 % 44.1   MCV 80.0 - 100.0 fL 82.9   MCH 26.0 - 34.0 pg 28.2   MCHC 32.0 - 36.0 g/dL 95.634.0   RDW 21.311.5 - 08.614.5 % 14.7High    Platelets 150 - 440 K/uL 328

## 2018-08-18 ENCOUNTER — Inpatient Hospital Stay: Payer: BLUE CROSS/BLUE SHIELD | Attending: Oncology | Admitting: Oncology

## 2018-08-18 ENCOUNTER — Inpatient Hospital Stay: Payer: BLUE CROSS/BLUE SHIELD

## 2018-08-18 ENCOUNTER — Other Ambulatory Visit: Payer: Self-pay

## 2018-08-18 ENCOUNTER — Encounter: Payer: Self-pay | Admitting: Oncology

## 2018-08-18 VITALS — BP 107/73 | HR 78 | Temp 98.8°F | Resp 18 | Ht 63.58 in | Wt 214.7 lb

## 2018-08-18 DIAGNOSIS — D509 Iron deficiency anemia, unspecified: Secondary | ICD-10-CM

## 2018-08-18 DIAGNOSIS — Z7901 Long term (current) use of anticoagulants: Secondary | ICD-10-CM | POA: Diagnosis not present

## 2018-08-18 DIAGNOSIS — Z86718 Personal history of other venous thrombosis and embolism: Secondary | ICD-10-CM | POA: Insufficient documentation

## 2018-08-18 DIAGNOSIS — F17210 Nicotine dependence, cigarettes, uncomplicated: Secondary | ICD-10-CM | POA: Diagnosis not present

## 2018-08-18 DIAGNOSIS — D508 Other iron deficiency anemias: Secondary | ICD-10-CM

## 2018-08-18 LAB — IRON AND TIBC
Iron: 68 ug/dL (ref 28–170)
Saturation Ratios: 17 % (ref 10.4–31.8)
TIBC: 410 ug/dL (ref 250–450)
UIBC: 343 ug/dL

## 2018-08-18 LAB — CBC
HCT: 44.5 % (ref 35.0–47.0)
Hemoglobin: 15.1 g/dL (ref 12.0–16.0)
MCH: 28.4 pg (ref 26.0–34.0)
MCHC: 34 g/dL (ref 32.0–36.0)
MCV: 83.5 fL (ref 80.0–100.0)
Platelets: 292 10*3/uL (ref 150–440)
RBC: 5.34 MIL/uL — AB (ref 3.80–5.20)
RDW: 14.4 % (ref 11.5–14.5)
WBC: 5.7 10*3/uL (ref 3.6–11.0)

## 2018-08-18 LAB — FERRITIN: FERRITIN: 32 ng/mL (ref 11–307)

## 2018-08-18 NOTE — Progress Notes (Signed)
No new changes noted today 

## 2018-08-20 NOTE — Progress Notes (Signed)
Hematology/Oncology Consult note [facility]  [phone] [phone]  Patient Care Team: Marisue Ivan, MD as PCP - General (Family Medicine)   Name of the patient: Cynthia Shelton  [id number]  [birth year]   Date of visit: 08/20/18  Diagnosis- iron deficiency anemia  Chief complaint/ Reason for visit- routine f/u of iron deficiency anemia  Heme/Onc history: patient is a 62 year old female who is known to have chronic microcytic anemia. CBC from 12/24/2016 showed white count of 7, H&H of 7.5/27.9 with an MCV of 54.5 and platelet count of 425. Her ferritin was low at 2 and iron level was low at 12. Back in September 2015 her white count was 6.1, H&H was 11.7/37 with an MCV of 68.8 and a platelet count of 476. Patient has not been evaluated by GI. She has been hesitant to see them as she was concerned about bleeding during endoscopy as she is on coumadin. Patient has a history of DVT in 2012and currently is on Coumadin.Patient states that her DVT was in her right lower extremity and was extensive. She apparently had genetic testing at that time which was negative. 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 Patient has not started taking oral iron yet. She denies any blood in stools or dark stools. Denies any frequent use of NSAIDS  Patient initially did not want to take IV iron. After no improvement in her H/H she agreed. Last dose of feraheme was in sept 2018. Patient declined GI intervention   Interval history-she is working on her weight loss.  Overall she feels well.  She does have mild fatigue.  She is now off Coumadin.  Denies any blood in stool or urine.  Denies any dark melanotic stools.  ECOG PS- 1 Pain scale- 0 Opioid associated constipation- no  Review of systems- Review of Systems  Constitutional: Negative for chills, fever, malaise/fatigue and weight loss.  HENT: Negative for congestion, ear  discharge and nosebleeds.   Eyes: Negative for blurred vision.  Respiratory: Negative for cough, hemoptysis, sputum production, shortness of breath and wheezing.   Cardiovascular: Negative for chest pain, palpitations, orthopnea and claudication.  Gastrointestinal: Negative for abdominal pain, blood in stool, constipation, diarrhea, heartburn, melena, nausea and vomiting.  Genitourinary: Negative for dysuria, flank pain, frequency, hematuria and urgency.  Musculoskeletal: Negative for back pain, joint pain and myalgias.  Skin: Negative for rash.  Neurological: Negative for dizziness, tingling, focal weakness, seizures, weakness and headaches.  Endo/Heme/Allergies: Does not bruise/bleed easily.  Psychiatric/Behavioral: Negative for depression and suicidal ideas. The patient does not have insomnia.        Allergies  Allergen Reactions  . Ciprofloxacin Other (See Comments)    Altered mental status  . Diazepam Other (See Comments)    Altered mental status  . Prednisone Other (See Comments)    Severe confusion  . Minocycline Other (See Comments) and Rash    thrush     Past Medical History:  Diagnosis Date  . Anemia   . Right leg DVT (HCC) 2012     Past Surgical History:  Procedure Laterality Date  . APPENDECTOMY    . DEBRIDEMENT AND CLOSURE WOUND  1983    Social History   Socioeconomic History  . Marital status: Unknown    Spouse name: Not on file  . Number of children: Not on file  . Years of education: Not on file  . Highest education level: Not on file  Occupational History  .  Not on file  Social Needs  . Financial resource strain: Not on file  . Food insecurity:    Worry: Not on file    Inability: Not on file  . Transportation needs:    Medical: Not on file    Non-medical: Not on file  Tobacco Use  . Smoking status: Current Every Day Smoker    Packs/day: 0.25    Years: 50.00    Pack years: 12.50    Types: Cigarettes  . Smokeless tobacco: Current User    . Tobacco comment: pt refd info  Substance and Sexual Activity  . Alcohol use: Not on file  . Drug use: Not on file  . Sexual activity: Not on file  Lifestyle  . Physical activity:    Days per week: Not on file    Minutes per session: Not on file  . Stress: Not on file  Relationships  . Social connections:    Talks on phone: Not on file    Gets together: Not on file    Attends religious service: Not on file    Active member of club or organization: Not on file    Attends meetings of clubs or organizations: Not on file    Relationship status: Not on file  . Intimate partner violence:    Fear of current or ex partner: Not on file    Emotionally abused: Not on file    Physically abused: Not on file    Forced sexual activity: Not on file  Other Topics Concern  . Not on file  Social History Narrative  . Not on file    Family History  Problem Relation Age of Onset  . Cirrhosis Mother   . Cancer Father      Current Outpatient Medications:  .  acetaminophen (TYLENOL) 500 MG tablet, Take by mouth., Disp: , Rfl:  .  ferrous sulfate 325 (65 FE) MG tablet, Take 325 mg by mouth 2 (two) times daily with a meal., Disp: , Rfl:  .  warfarin (COUMADIN) 5 MG tablet, take 2 tablets by mouth once daily EXCEPT 1 TABLET ON SUNDAYS, Disp: , Rfl: 0  Physical exam:  Vitals:   08/18/18 1439  BP: 107/73  Pulse: 78  Resp: 18  Temp: 98.8 F (37.1 C)  TempSrc: Tympanic  Weight: 214 lb 11.2 oz (97.4 kg)  Height: 5' 3.58" (1.615 m)   Physical Exam  Constitutional: She is oriented to person, place, and time.  Patient is obese.  Does not appear to be in any acute distress  HENT:  Head: Normocephalic and atraumatic.  Eyes: Pupils are equal, round, and reactive to light. EOM are normal.  Neck: Normal range of motion.  Cardiovascular: Normal rate, regular rhythm and normal heart sounds.  Pulmonary/Chest: Effort normal and breath sounds normal.  Abdominal: Soft. Bowel sounds are normal.   Neurological: She is alert and oriented to person, place, and time.  Skin: Skin is warm and dry.     No flowsheet data found. CBC Latest Ref Rng & Units 08/18/2018  WBC 3.6 - 11.0 K/uL 5.7  Hemoglobin 12.0 - 16.0 g/dL 16.1  Hematocrit 09.6 - 47.0 % 44.5  Platelets 150 - 440 K/uL 292     Assessment and plan- Patient is a 62 y.o. female with iron deficiency anemia of unclear etiology.  Patient declined GI work-up.  She is here for routine follow-up of iron deficiency anemia.  Patient is not anemic today and her iron studies are normal.  She has not required IV iron over the last 1 year.  She can therefore continue to follow-up with Dr. Burnadette Pop at this time and get her iron studies checked every 6 months.  If she were to have any recurrence of her iron deficiency she can be referred back to [place] in the future.  Patient is now off Coumadin and has not had any recurrence of her DVT.  She is planning to go on a flight trip to [place] for her son's wedding next year in February.  I have encouraged her to move around during her flight and keep yourself well-hydrated.  I would not recommend restarting anticoagulation just for the flight.   Visit Diagnosis 1. Iron deficiency anemia, unspecified iron deficiency anemia type      Dr. Owens Shark, MD, MPH [facility] at [facility] [phone] 08/20/2018 11:14 AM

## 2019-12-16 ENCOUNTER — Other Ambulatory Visit: Payer: Self-pay | Admitting: Family Medicine

## 2019-12-16 DIAGNOSIS — Z1231 Encounter for screening mammogram for malignant neoplasm of breast: Secondary | ICD-10-CM

## 2019-12-27 ENCOUNTER — Ambulatory Visit
Admission: RE | Admit: 2019-12-27 | Discharge: 2019-12-27 | Disposition: A | Discharge status: Home / Self Care | Payer: BC Managed Care – PPO | Source: Ambulatory Visit | Attending: Family Medicine | Admitting: Family Medicine

## 2019-12-27 ENCOUNTER — Other Ambulatory Visit: Payer: Self-pay

## 2019-12-27 ENCOUNTER — Encounter (INDEPENDENT_AMBULATORY_CARE_PROVIDER_SITE_OTHER): Payer: Self-pay

## 2019-12-27 DIAGNOSIS — Z1231 Encounter for screening mammogram for malignant neoplasm of breast: Secondary | ICD-10-CM | POA: Diagnosis not present

## 2019-12-28 ENCOUNTER — Ambulatory Visit: Payer: BLUE CROSS/BLUE SHIELD

## 2020-07-15 IMAGING — MG DIGITAL SCREENING BILAT W/ TOMO W/ CAD
6 of 12 series · 6 of 36 positions shown · non-contrast
Comparison: Previous exam(s).

ACR Breast Density Category a: The breast tissue is almost entirely
fatty.

CLINICAL DATA: Screening.

EXAM:
DIGITAL SCREENING BILATERAL MAMMOGRAM WITH TOMO AND CAD

[R CC synth-2D (1 of 2)]
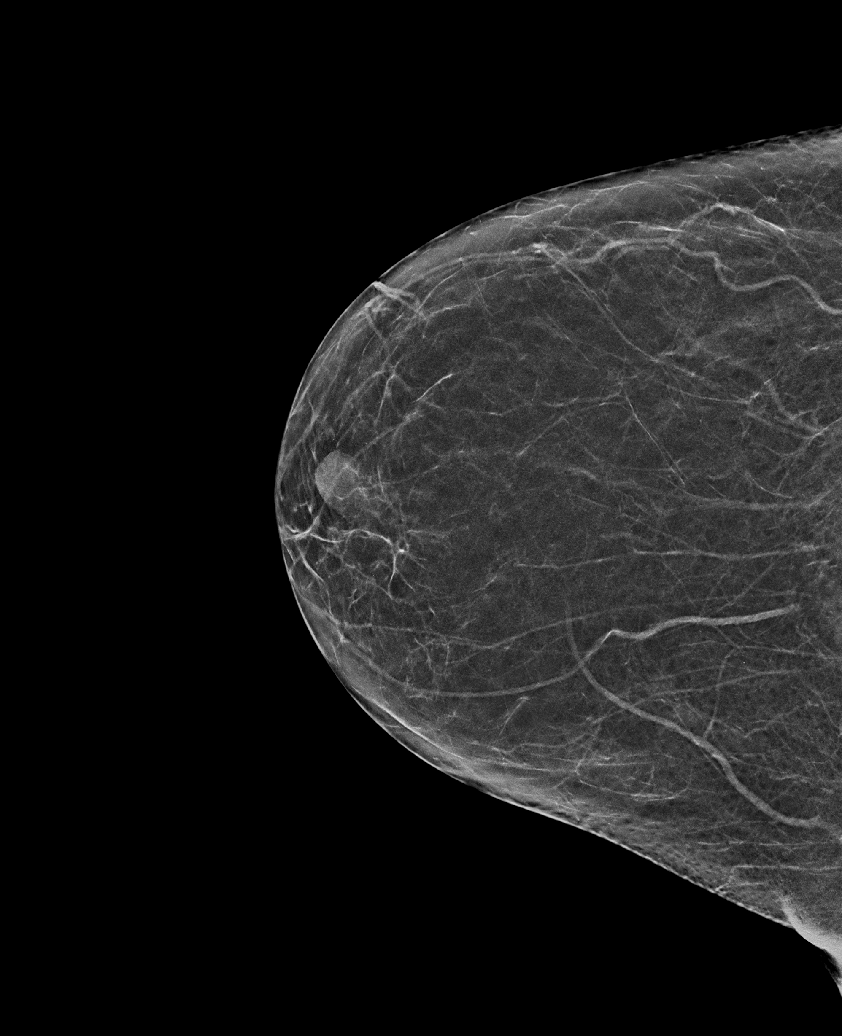

[R CC synth-2D (2 of 2)]
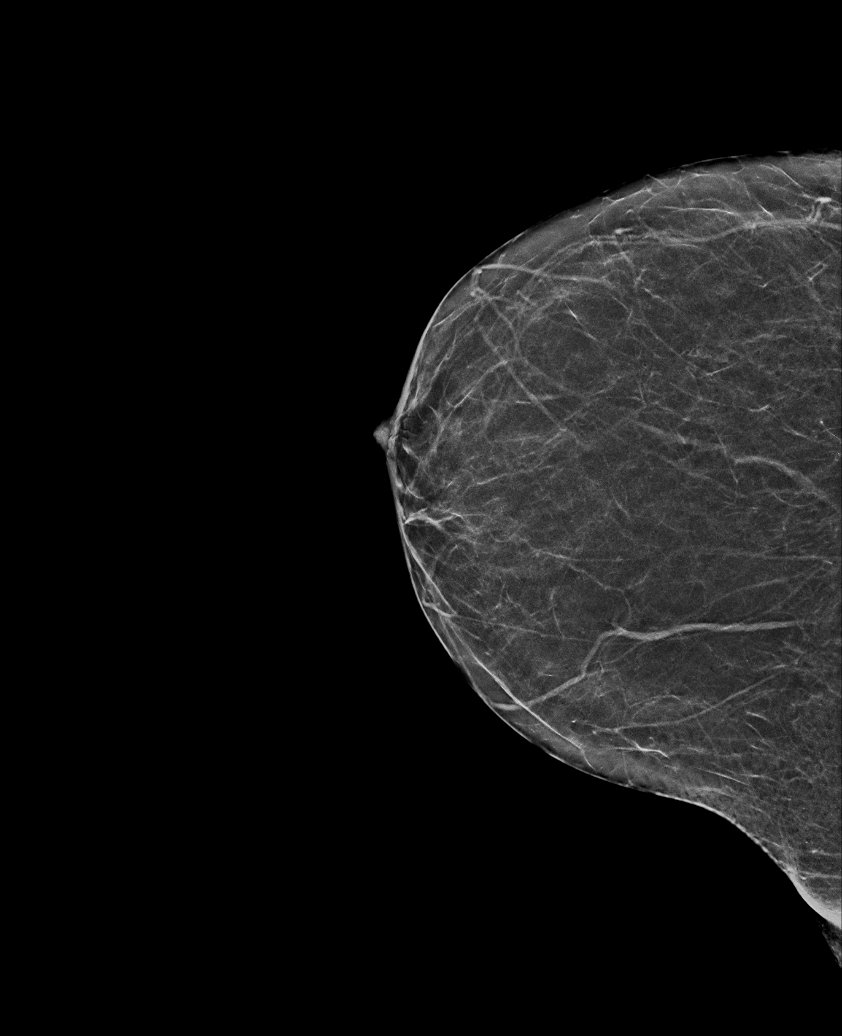

[R MLO synth-2D]
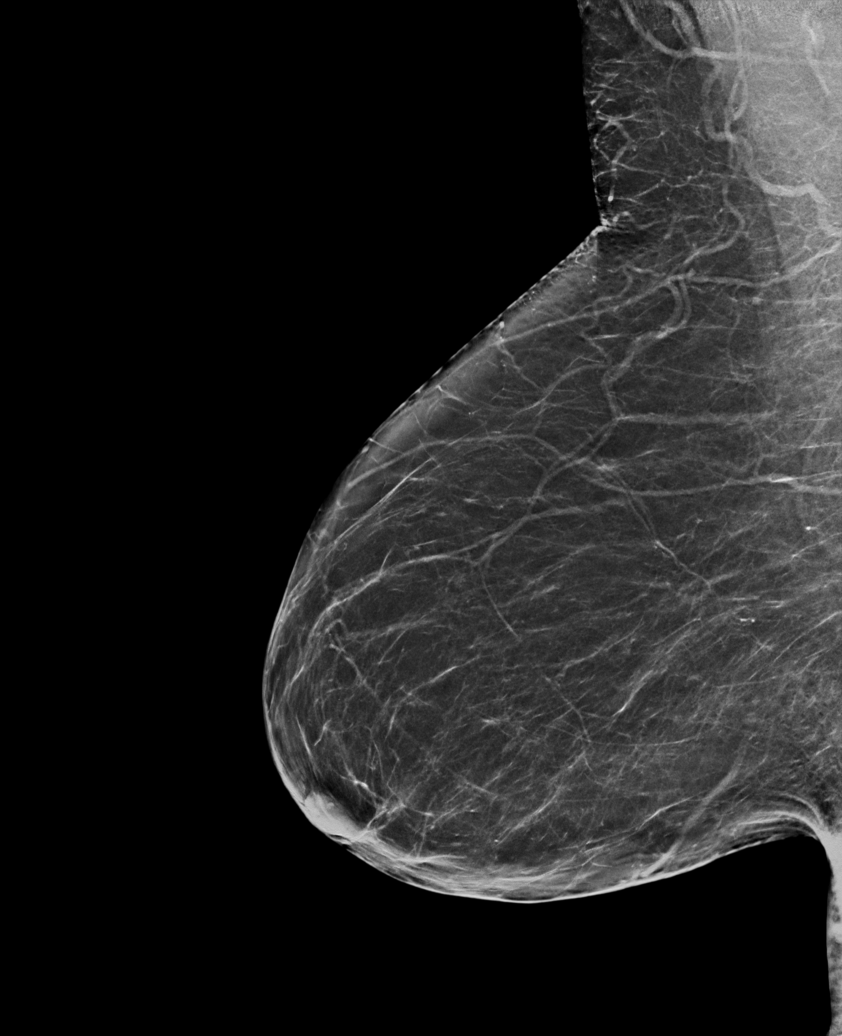

[L MLO synth-2D]
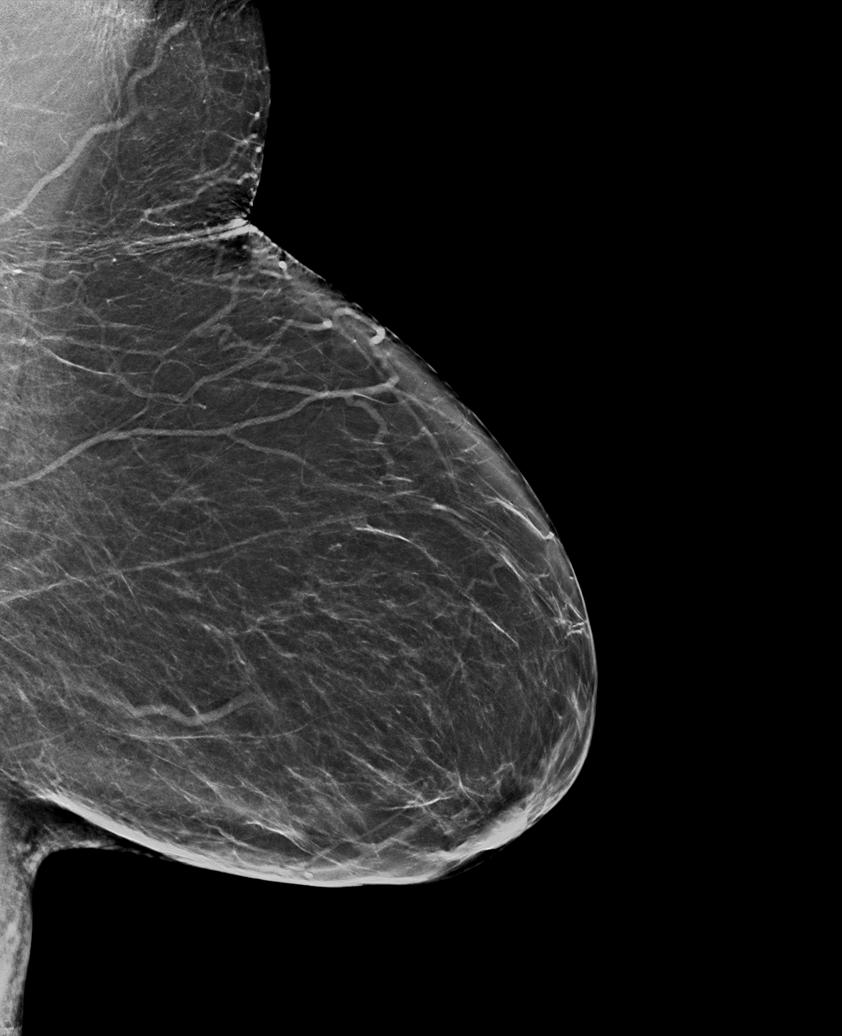

[L CC synth-2D (1 of 2)]
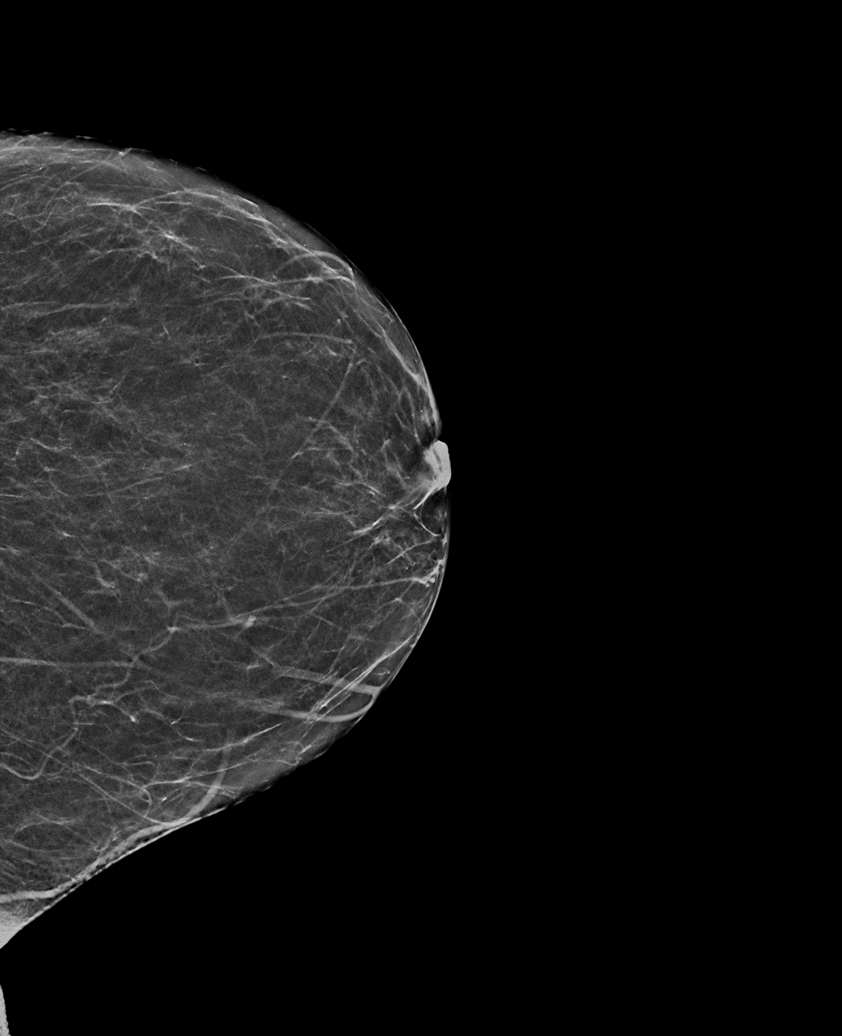

[L CC synth-2D (2 of 2)]
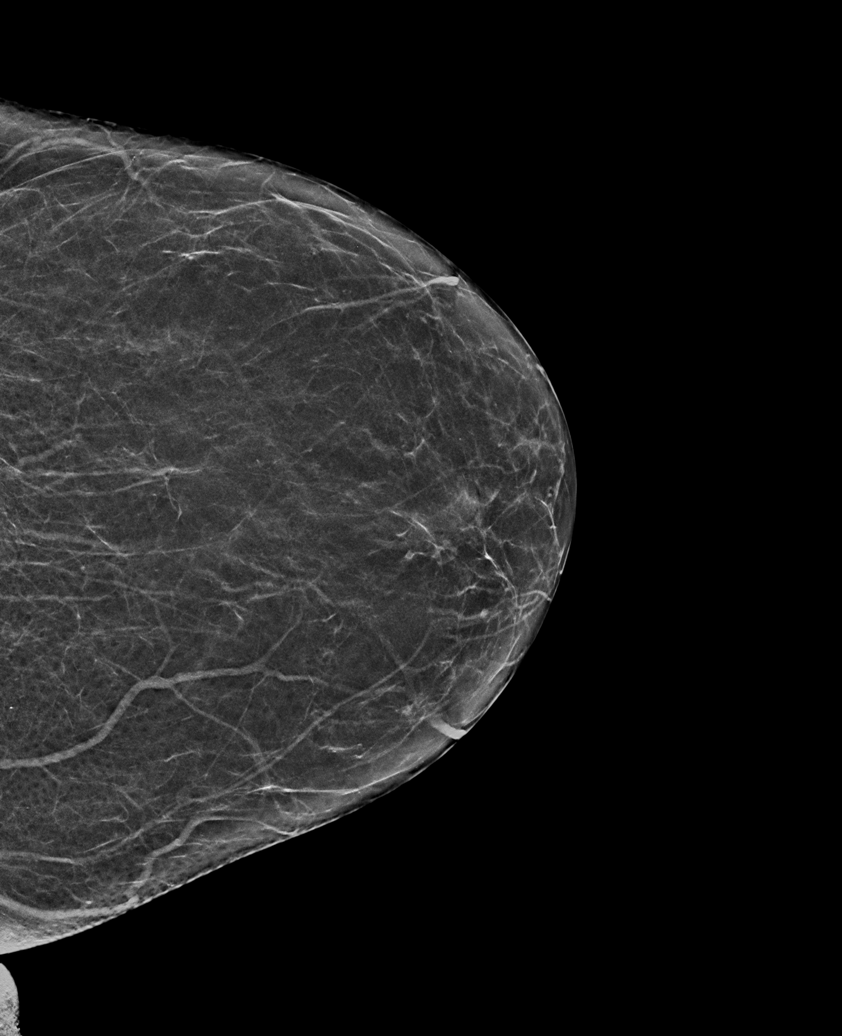

[6 of 36 positions shown; findings below may reference images not displayed]

FINDINGS: There are no findings suspicious for malignancy. Images were
processed with CAD.
IMPRESSION: No mammographic evidence of malignancy. A result letter of this
screening mammogram will be mailed directly to the patient.

RECOMMENDATION:
Screening mammogram in one year. (Code:[id number]-[id number])

BI-RADS CATEGORY  1: Negative.

## 2024-11-20 ENCOUNTER — Ambulatory Visit
Admission: EM | Admit: 2024-11-20 | Discharge: 2024-11-20 | Disposition: A | Discharge status: Home / Self Care | Attending: Emergency Medicine | Admitting: Emergency Medicine

## 2024-11-20 ENCOUNTER — Encounter: Payer: Self-pay | Admitting: Oncology

## 2024-11-20 ENCOUNTER — Telehealth: Payer: Self-pay

## 2024-11-20 DIAGNOSIS — R0602 Shortness of breath: Secondary | ICD-10-CM

## 2024-11-20 DIAGNOSIS — R053 Chronic cough: Secondary | ICD-10-CM | POA: Diagnosis not present

## 2024-11-20 DIAGNOSIS — J069 Acute upper respiratory infection, unspecified: Secondary | ICD-10-CM

## 2024-11-20 DIAGNOSIS — R112 Nausea with vomiting, unspecified: Secondary | ICD-10-CM | POA: Diagnosis not present

## 2024-11-20 DIAGNOSIS — J410 Simple chronic bronchitis: Secondary | ICD-10-CM | POA: Insufficient documentation

## 2024-11-20 DIAGNOSIS — F17200 Nicotine dependence, unspecified, uncomplicated: Secondary | ICD-10-CM

## 2024-11-20 DIAGNOSIS — E66811 Obesity, class 1: Secondary | ICD-10-CM | POA: Insufficient documentation

## 2024-11-20 DIAGNOSIS — F1999 Other psychoactive substance use, unspecified with unspecified psychoactive substance-induced disorder: Secondary | ICD-10-CM | POA: Insufficient documentation

## 2024-11-20 LAB — POC SOFIA SARS ANTIGEN FIA: SARS Coronavirus 2 Ag: NEGATIVE

## 2024-11-20 LAB — POCT INFLUENZA A/B
Influenza A, POC: NEGATIVE
Influenza B, POC: NEGATIVE

## 2024-11-20 MED ORDER — DOXYCYCLINE HYCLATE 100 MG PO CAPS: 100.0000 mg | ORAL_CAPSULE | Freq: Two times a day (BID) | ORAL | 0 refills | Status: AC

## 2024-11-20 MED ORDER — ONDANSETRON 4 MG PO TBDP: 4.0000 mg | ORAL_TABLET | Freq: Three times a day (TID) | ORAL | 0 refills | Status: AC | PRN

## 2024-11-20 MED ORDER — LEVALBUTEROL TARTRATE 45 MCG/ACT IN AERO: 1.0000 | INHALATION_SPRAY | RESPIRATORY_TRACT | 12 refills | Status: AC | PRN

## 2024-11-20 NOTE — Discharge Instructions (Addendum)
 Follow up with your primary care provider on Monday.  Go to the emergency department if you have persistent or worsening symptoms.    Take the Zofran  as directed for nausea and vomiting.  Keep yourself hydrated with clear liquids such as water.    Take the doxycycline  and use the Xopenex  inhaler as directed.

## 2024-11-20 NOTE — ED Provider Notes (Signed)
 " Cynthia Shelton    CSN: [id number] Arrival date & time: 11/20/24  1040      History   Chief Complaint Chief Complaint  Patient presents with   Shortness of Breath    HPI Cynthia Shelton is a 68 y.o. female.  Patient presents with 2-3 day history of congestion and cough.  She reports some mild shortness of breath.  She vomited 3 times since last night which she attributes to eating sour cream that tasted off.  She reports no concern for aspiration.  No fever or chest pain.  No OTC medications taken today. Her medical history includes tobacco use disorder, smoker's cough, prediabetes, DVT, iron deficiency anemia.  The history is provided by the patient and medical records.    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      Home Medications    Prior to Admission medications  Medication Sig Start Date End Date Taking? Authorizing Provider  doxycycline  (VIBRAMYCIN ) 100 MG capsule Take 1 capsule (100 mg total) by mouth 2 (two) times daily for 7 days. 11/20/24 11/27/24 Yes Corlis Burnard DEL, NP  levalbuterol  (XOPENEX  HFA) 45 MCG/ACT inhaler Inhale 1-2 puffs into the lungs every 4 (four) hours as needed for wheezing. 11/20/24  Yes Corlis Burnard DEL, NP  ondansetron  (ZOFRAN -ODT) 4 MG  disintegrating tablet Take 1 tablet (4 mg total) by mouth every 8 (eight) hours as needed for nausea or vomiting. 11/20/24  Yes Corlis Burnard DEL, NP  acetaminophen (TYLENOL) 500 MG tablet Take by mouth.    [provider]  ferrous sulfate 325 (65 FE) MG tablet Take 325 mg by mouth 2 (two) times daily with a meal.    [provider]  warfarin (COUMADIN) 5 MG tablet take 2 tablets by mouth once daily EXCEPT 1 TABLET ON SUNDAYS 12/20/16   [provider]    Family History Family History  Problem Relation Age of Onset   Cirrhosis Mother    Cancer Father    Breast cancer Neg Hx     Social History Social History[1]   Allergies   Amoxicillin-pot clavulanate, Ciprofloxacin, Diazepam, Prednisone, and Minocycline   Review of Systems Review of Systems  Constitutional:  Positive for chills. Negative for fever.  HENT:  Positive for congestion and postnasal drip. Negative for ear pain and sore throat.   Respiratory:  Positive for cough and shortness of breath.   Cardiovascular:  Negative for chest pain and palpitations.  Gastrointestinal:  Positive for vomiting. Negative for diarrhea.     Physical Exam Triage Vital Signs ED Triage Vitals [11/20/24 1229]  Encounter Vitals Group     BP 135/75     Girls Systolic BP Percentile  Girls Diastolic BP Percentile      Boys Systolic BP Percentile      Boys Diastolic BP Percentile      Pulse Rate (!) 109     Resp 20     Temp 99.9 F (37.7 C)     Temp Source Oral     SpO2 94 %     Weight      Height      Head Circumference      Peak Flow      Pain Score      Pain Loc      Pain Education      Exclude from Growth Chart    No data found.  Updated Vital Signs BP 135/75 (BP Location: Right Arm)   Pulse (!) 105   Temp 99.9 F (37.7 C) (Oral)   Resp 20   SpO2 94%   Visual Acuity Right Eye Distance:   Left Eye Distance:   Bilateral Distance:    Right Eye Near:   Left Eye Near:    Bilateral Near:      Physical Exam Constitutional:      General: She is not in acute distress. HENT:     Right Ear: Tympanic membrane normal.     Left Ear: Tympanic membrane normal.     Nose: Congestion and rhinorrhea present.     Mouth/Throat:     Mouth: Mucous membranes are moist.     Pharynx: Oropharynx is clear.  Cardiovascular:     Rate and Rhythm: Normal rate and regular rhythm.     Heart sounds: Normal heart sounds.  Pulmonary:     Effort: Pulmonary effort is normal. No respiratory distress.     Breath sounds: Decreased air movement present. Rhonchi present.     Comments: Faint scattered rhonchi. Abdominal:     General: Bowel sounds are normal.     Palpations: Abdomen is soft.     Tenderness: There is no abdominal tenderness.  Neurological:     Mental Status: She is alert.      UC Treatments / Results  Labs (all labs ordered are listed, but only abnormal results are displayed) Labs Reviewed  POCT INFLUENZA A/B - Normal  POC SOFIA SARS ANTIGEN FIA - Normal    EKG   Radiology No results found.  Procedures Procedures (including critical care time)  Medications Ordered in UC Medications - No data to display  Initial Impression / Assessment and Plan / UC Course  I have reviewed the triage vital signs and the nursing notes.  Pertinent labs & imaging results that were available during my care of the patient were reviewed by me and considered in my medical decision making (see chart for details).    Shortness of breath, acute URI, chronic cough, nausea and vomiting, tobacco use disorder.  O2 sat 94% on room air.  Patient declines transfer to the ED.  She declines chest x-ray and nebulizer treatment.  She declines prescription for prednisone as she states it causes severe confusion.  She declines albuterol inhaler as she states it causes her to feel nervous and jittery.  Her COVID and flu tests are negative.  Treating today with Xopenex  inhaler and doxycycline  x 7 days.  Zofran   prescribed for nausea and vomiting.  Instructed her to follow-up with her PCP on Monday.  Strict ED precautions given.  Education provided on shortness of breath, chronic cough, URI, nausea and vomiting.  She agrees to plan of care.  Final Clinical  Impressions(s) / UC Diagnoses   Final diagnoses:  Shortness of breath  Acute upper respiratory infection  Chronic cough  Nausea and vomiting, unspecified vomiting type  Tobacco use disorder     Discharge Instructions      Follow up with your primary care provider on Monday.  Go to the emergency department if you have persistent or worsening symptoms.    Take the Zofran  as directed for nausea and vomiting.  Keep yourself hydrated with clear liquids such as water.    Take the doxycycline  and use the Xopenex  inhaler as directed.       ED Prescriptions     Medication Sig Dispense Auth. Provider   levalbuterol  (XOPENEX  HFA) 45 MCG/ACT inhaler Inhale 1-2 puffs into the lungs every 4 (four) hours as needed for wheezing. 1 each Corlis Burnard DEL, NP   ondansetron  (ZOFRAN -ODT) 4 MG disintegrating tablet Take 1 tablet (4 mg total) by mouth every 8 (eight) hours as needed for nausea or vomiting. 20 tablet Corlis Burnard DEL, NP   doxycycline  (VIBRAMYCIN ) 100 MG capsule Take 1 capsule (100 mg total) by mouth 2 (two) times daily for 7 days. 14 capsule Corlis Burnard DEL, NP      PDMP not reviewed this encounter.    [1]  Social History Tobacco Use   Smoking status: Former    Average packs/day: 0.3 packs/day for 50.0 years (12.5 ttl pk-yrs)    Types: Cigarettes    Start date: 11/13/2024   Smokeless tobacco: Current   Tobacco comments:    pt refd info  Substance Use Topics   Alcohol use: Never   Drug use: Never     Corlis Burnard DEL, NP 11/20/24 1320  "

## 2024-11-20 NOTE — ED Triage Notes (Signed)
 Patient presents to UC for SOB, vomiting x Thursday night. Friday she developed chills. Took a dose of tylenol last night.

## 2024-11-20 NOTE — Telephone Encounter (Signed)
 [facility] called regarding doxy prescription that was sent today. Deward Gower D wanted to verify tetracycline allergy. Pt chart reviewed and verified minocycline allergy with low severity outcome. Pt develops thrush when on this class of antibiotics. Information given to pharmacist. Per Corlis, NP medication can be filled for pt.

## 2024-12-02 ENCOUNTER — Observation Stay: Admission: EM | Admit: 2024-12-02 | Discharge: 2024-12-03 | Disposition: A | Discharge status: Home / Self Care

## 2024-12-02 ENCOUNTER — Emergency Department

## 2024-12-02 ENCOUNTER — Other Ambulatory Visit: Payer: Self-pay

## 2024-12-02 DIAGNOSIS — J9601 Acute respiratory failure with hypoxia: Secondary | ICD-10-CM | POA: Insufficient documentation

## 2024-12-02 DIAGNOSIS — G9389 Other specified disorders of brain: Secondary | ICD-10-CM | POA: Diagnosis not present

## 2024-12-02 DIAGNOSIS — R911 Solitary pulmonary nodule: Secondary | ICD-10-CM | POA: Diagnosis not present

## 2024-12-02 DIAGNOSIS — Z87891 Personal history of nicotine dependence: Secondary | ICD-10-CM | POA: Diagnosis not present

## 2024-12-02 DIAGNOSIS — R0602 Shortness of breath: Secondary | ICD-10-CM | POA: Diagnosis present

## 2024-12-02 DIAGNOSIS — J440 Chronic obstructive pulmonary disease with acute lower respiratory infection: Secondary | ICD-10-CM | POA: Insufficient documentation

## 2024-12-02 DIAGNOSIS — J9801 Acute bronchospasm: Secondary | ICD-10-CM

## 2024-12-02 DIAGNOSIS — R0902 Hypoxemia: Principal | ICD-10-CM

## 2024-12-02 DIAGNOSIS — Z86718 Personal history of other venous thrombosis and embolism: Secondary | ICD-10-CM | POA: Diagnosis not present

## 2024-12-02 DIAGNOSIS — R4182 Altered mental status, unspecified: Secondary | ICD-10-CM | POA: Diagnosis present

## 2024-12-02 DIAGNOSIS — J189 Pneumonia, unspecified organism: Principal | ICD-10-CM | POA: Insufficient documentation

## 2024-12-02 DIAGNOSIS — I700 Atherosclerosis of aorta: Secondary | ICD-10-CM | POA: Diagnosis not present

## 2024-12-02 DIAGNOSIS — I2510 Atherosclerotic heart disease of native coronary artery without angina pectoris: Secondary | ICD-10-CM | POA: Insufficient documentation

## 2024-12-02 DIAGNOSIS — E663 Overweight: Secondary | ICD-10-CM | POA: Diagnosis not present

## 2024-12-02 DIAGNOSIS — J441 Chronic obstructive pulmonary disease with (acute) exacerbation: Secondary | ICD-10-CM | POA: Diagnosis not present

## 2024-12-02 DIAGNOSIS — Z6828 Body mass index (BMI) 28.0-28.9, adult: Secondary | ICD-10-CM | POA: Insufficient documentation

## 2024-12-02 DIAGNOSIS — J0130 Acute sphenoidal sinusitis, unspecified: Secondary | ICD-10-CM | POA: Insufficient documentation

## 2024-12-02 DIAGNOSIS — R918 Other nonspecific abnormal finding of lung field: Secondary | ICD-10-CM | POA: Insufficient documentation

## 2024-12-02 LAB — CBC
HCT: 43.4 % (ref 36.0–46.0)
HCT: 38.7 % (ref 36.0–46.0)
Hemoglobin: 13.9 g/dL (ref 12.0–15.0)
Hemoglobin: 12.7 g/dL (ref 12.0–15.0)
MCH: 26.8 pg (ref 26.0–34.0)
MCH: 26.9 pg (ref 26.0–34.0)
MCHC: 32 g/dL (ref 30.0–36.0)
MCHC: 32.8 g/dL (ref 30.0–36.0)
MCV: 83.8 fL (ref 80.0–100.0)
MCV: 82 fL (ref 80.0–100.0)
Platelets: 460 K/uL — ABNORMAL HIGH (ref 150–400)
Platelets: 484 K/uL — ABNORMAL HIGH (ref 150–400)
RBC: 5.18 MIL/uL — ABNORMAL HIGH (ref 3.87–5.11)
RBC: 4.72 MIL/uL (ref 3.87–5.11)
RDW: 15 % (ref 11.5–15.5)
RDW: 15 % (ref 11.5–15.5)
WBC: 15.7 K/uL — ABNORMAL HIGH (ref 4.0–10.5)
WBC: 13.8 K/uL — ABNORMAL HIGH (ref 4.0–10.5)
nRBC: 0 % (ref 0.0–0.2)
nRBC: 0 % (ref 0.0–0.2)

## 2024-12-02 LAB — BASIC METABOLIC PANEL WITH GFR
Anion gap: 9 (ref 5–15)
BUN: 12 mg/dL (ref 8–23)
CO2: 27 mmol/L (ref 22–32)
Calcium: 8.2 mg/dL — ABNORMAL LOW (ref 8.9–10.3)
Chloride: 100 mmol/L (ref 98–111)
Creatinine, Ser: 0.51 mg/dL (ref 0.44–1.00)
GFR, Estimated: >60 mL/min
Glucose, Bld: 166 mg/dL — ABNORMAL HIGH (ref 70–99)
Potassium: 3.7 mmol/L (ref 3.5–5.1)
Sodium: 137 mmol/L (ref 135–145)

## 2024-12-02 LAB — RESP PANEL BY RT-PCR (RSV, FLU A&B, COVID)  RVPGX2
Influenza A by PCR: NEGATIVE
Influenza B by PCR: NEGATIVE
Resp Syncytial Virus by PCR: NEGATIVE
SARS Coronavirus 2 by RT PCR: NEGATIVE

## 2024-12-02 LAB — PRO BRAIN NATRIURETIC PEPTIDE: Pro Brain Natriuretic Peptide: 338 pg/mL — ABNORMAL HIGH

## 2024-12-02 LAB — LACTIC ACID, PLASMA: Lactic Acid, Venous: 1.2 mmol/L (ref 0.5–1.9)

## 2024-12-02 LAB — BLOOD GAS, VENOUS

## 2024-12-02 MED ORDER — FAMOTIDINE IN NACL 20-0.9 MG/50ML-% IV SOLN
20.0000 mg | Freq: Once | INTRAVENOUS | Status: DC
  Filled 2024-12-02: qty 50

## 2024-12-02 MED ORDER — ACETAMINOPHEN 500 MG PO TABS: 500.0000 mg | ORAL_TABLET | Freq: Four times a day (QID) | ORAL | Status: DC | PRN

## 2024-12-02 MED ORDER — ONDANSETRON HCL 4 MG/2ML IJ SOLN
4.0000 mg | Freq: Once | INTRAMUSCULAR | Status: DC
  Filled 2024-12-02: qty 2

## 2024-12-02 MED ORDER — BUDESONIDE 0.25 MG/2ML IN SUSP
0.2500 mg | Freq: Two times a day (BID) | RESPIRATORY_TRACT | Status: DC
  Administered 2024-12-02 – 2024-12-03 (×2): 0.25 mg via RESPIRATORY_TRACT
  Filled 2024-12-02 (×2): qty 2

## 2024-12-02 MED ORDER — AZITHROMYCIN 500 MG PO TABS: 500.0000 mg | ORAL_TABLET | Freq: Every day | ORAL | Status: DC

## 2024-12-02 MED ORDER — SODIUM CHLORIDE 0.9 % IV SOLN
1.0000 g | Freq: Once | INTRAVENOUS | Status: AC
  Administered 2024-12-02: 1 g via INTRAVENOUS
  Filled 2024-12-02: qty 10

## 2024-12-02 MED ORDER — SODIUM CHLORIDE 0.9 % IV SOLN
100.0000 mg | Freq: Once | INTRAVENOUS | Status: AC
  Administered 2024-12-02: 100 mg via INTRAVENOUS
  Filled 2024-12-02: qty 100

## 2024-12-02 MED ORDER — IPRATROPIUM-ALBUTEROL 0.5-2.5 (3) MG/3ML IN SOLN
3.0000 mL | Freq: Once | RESPIRATORY_TRACT | Status: AC
  Administered 2024-12-02: 3 mL via RESPIRATORY_TRACT
  Filled 2024-12-02: qty 3

## 2024-12-02 MED ORDER — AZITHROMYCIN 500 MG PO TABS
500.0000 mg | ORAL_TABLET | Freq: Every day | ORAL | Status: DC
  Administered 2024-12-03: 500 mg via ORAL
  Filled 2024-12-02: qty 1

## 2024-12-02 MED ORDER — ENOXAPARIN SODIUM 40 MG/0.4ML IJ SOSY
40.0000 mg | PREFILLED_SYRINGE | Freq: Every day | INTRAMUSCULAR | Status: DC
  Administered 2024-12-02 – 2024-12-03 (×2): 40 mg via SUBCUTANEOUS
  Filled 2024-12-02 (×2): qty 0.4

## 2024-12-02 MED ORDER — LEVALBUTEROL HCL 0.63 MG/3ML IN NEBU: 0.6300 mg | INHALATION_SOLUTION | RESPIRATORY_TRACT | Status: DC | PRN

## 2024-12-02 MED ORDER — SODIUM CHLORIDE 0.9 % IV SOLN
500.0000 mg | INTRAVENOUS | Status: DC
  Filled 2024-12-02: qty 5

## 2024-12-02 MED ORDER — IPRATROPIUM BROMIDE 0.02 % IN SOLN
0.5000 mg | Freq: Four times a day (QID) | RESPIRATORY_TRACT | Status: AC
  Administered 2024-12-02 – 2024-12-03 (×4): 0.5 mg via RESPIRATORY_TRACT
  Filled 2024-12-02 (×3): qty 2.5

## 2024-12-02 MED ORDER — METHYLPREDNISOLONE SODIUM SUCC 125 MG IJ SOLR
125.0000 mg | Freq: Once | INTRAMUSCULAR | Status: DC
  Filled 2024-12-02: qty 2

## 2024-12-02 NOTE — Progress Notes (Signed)
 Full note to follow - consult vs admission H&P. Received signout from Dr. Nicholaus - pt w/ SOB, tx for PNA and apparent COPD, recently stopped smoking. Pt has been declining some therapies here. On 2L O2 now. I ordered ambulation w/ oxygenation assessment to determine function / help guide decision making (please see order comments for documentation template). Will assess patient once this is completed. Will consider for possible admission based on results and interview/exam. Reviewed the above w/ Dr. Nicholaus. Will follow!

## 2024-12-02 NOTE — ED Triage Notes (Addendum)
 Pt comes with c/o cough congestions, sob and AMS. Pt states she had nora virus after Christmas for about 4-5 days. Pt states since she has not felt right. Pt states she also quit smoking and was having withdrawals from that. Pt states she would get things mixed up easily. Pt states this would come and go. Pt states junky cough.   Pt is A*OX4. Pt denies any numbness, tingling and no cp. Pt states some dizziness earlier but it subsided.

## 2024-12-02 NOTE — H&P (Signed)
 " HISTORY AND PHYSICAL    Cynthia Shelton   FMW:[id number] DOB: 1956/08/13   Date of Service: 12/02/2024 Requesting physician/APP from ED: Treatment Team:  Attending Provider: Marsa Edelman, DO  PCP: Alla Amis, MD     HPI: Cynthia Shelton is a 69 y.o. female with past medical history of prediabetes, remote history of DVT in 2012, iron deficiency anemia, history of tobacco use recently quit, she presents to ED on 07/14/2025 with chief complaint shortness of breath, cough.  Symptoms have been ongoing couple of weeks, initially seen in urgent care 11/20/2024 and treated for URI, prescribed doxycycline  as well as Xopenex  inhaler.  Reportedly patient also had norovirus diagnosis not long after that.  Was feeling some better but then shortness of breath worsening again, presented to the ED today.  Denies fever/chills, reports cough is generating clear mucus.  In the ED, new oxygen requirement SpO2 90% on room air and improved to 93 to 98% on 3 L nasal cannula.  CT chest, patient declined a CT angio with contrast, CT demonstrated moderate bilateral multifocal airspace process worse over lingula/LUL and RML, question infectious process versus atypical infection/inflammatory process.  Also noted subpleural nodule left lower lobe will need follow-up CT in 6 to 8 weeks.  Admitted to hospitalist service for COPD exacerbation/community-acquired pneumonia.    Consultants:  none  Procedures: none      ASSESSMENT & PLAN:   Community-acquired pneumonia -sepsis has been ruled out Doxycycline   No formal diagnosis COPD but clinically suspect COPD exacerbation Jitteriness with albuterol , continue leave albuterol  as needed Ipratropium and budesonide  nebs scheduled Patient declines systemic steroids due to history of poor reactions with this in the past  Acute hypoxic respiratory failure due to CAP/COPD O2 supplementation to titrate up as needed, taper off as able Treat underlying causes  as noted  History of tobacco use disorder, currently in remission Encourage continued abstinence from tobacco products  Lung nodule See CT report, may need follow-up in about 6 weeks    DVT prophylaxis: Lovenox  Pertinent IV fluids/nutrition: No IV fluids, regular diet Central lines / invasive devices: None  Code Status: DO NOT RESUSCITATE, patient states that she would not want CPR/life support in the event of an arrest Family Communication: None at this time  Disposition: Observation, MedSurg TOC needs: None at this time Barriers to discharge / significant pending items: Oxygen requirement                  Review of Systems:  Review of Systems  Constitutional:  Positive for malaise/fatigue. Negative for chills, fever and weight loss.  HENT:  Positive for sinus pain. Negative for sore throat.   Respiratory:  Positive for cough, sputum production, shortness of breath and wheezing. Negative for hemoptysis.   Cardiovascular:  Negative for chest pain and leg swelling.  Genitourinary:  Negative for dysuria, frequency and urgency.  Skin:  Negative for rash.  Neurological:  Negative for dizziness.  Psychiatric/Behavioral:  Negative for depression.        has a past medical history of Anemia and Right leg DVT (HCC) (2012).  Medications Ordered Prior to Encounter[1]   Allergies  Allergen Reactions   Amoxicillin-Pot Clavulanate Nausea And Vomiting   Ciprofloxacin Other (See Comments)    Altered mental status   Prednisone Other (See Comments)    Severe confusion   Minocycline Other (See Comments), Rash and Dermatitis    thrush  Other Reaction(s): Other (See Comments),Rash  thrush  thrush  reports that she has quit smoking. Her smoking use included cigarettes. She quit smoking about 2 weeks ago. She has a 12.5 pack-year smoking history. She uses smokeless tobacco. She reports that she does not drink alcohol and does not use drugs. 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  Past Surgical History:  Procedure Laterality Date   APPENDECTOMY     DEBRIDEMENT AND CLOSURE WOUND  1983          Objective Findings:  Vitals:   12/02/24 1100 12/02/24 1300 12/02/24 1420 12/02/24 1421  BP: 139/89 (!) 141/97 131/84   Pulse: 95 99  92  Resp: (!) 24 18 19 18   Temp:  98.4 F (36.9 C)    TempSrc:  Oral    SpO2: 98% 93% 90% 90%  Weight:      Height:       No intake or output data in the 24 hours ending 12/02/24 1541 Filed Weights   12/02/24 0905  Weight: 73.9 kg    Examination:  Physical Exam Constitutional:      General: She is not in acute distress. Cardiovascular:     Rate and Rhythm: Normal rate and regular rhythm.  Pulmonary:     Effort: Pulmonary effort is normal. No respiratory distress.     Breath sounds: Decreased air movement present. Wheezing (faint / scattered) present. No rhonchi or rales.  Skin:    General: Skin is warm and dry.  Neurological:     General: No focal deficit present.     Mental Status: She is alert and oriented to person, place, and time. Mental status is at baseline.  Psychiatric:        Mood and Affect: Mood normal.        Behavior: Behavior normal.          Scheduled Medications:   [START ON 12/03/2024] azithromycin   500 mg Oral Daily   budesonide  (PULMICORT ) nebulizer solution  0.25 mg Nebulization BID   enoxaparin  (LOVENOX ) injection  40 mg Subcutaneous Daily   ipratropium  0.5 mg Nebulization Q6H   methylPREDNISolone  (SOLU-MEDROL ) injection  125 mg Intravenous Once   ondansetron  (ZOFRAN ) IV  4 mg Intravenous Once    Continuous Infusions:  doxycycline  (VIBRAMYCIN ) IV 100 mg (12/02/24 1351)   famotidine  (PEPCID ) IV      PRN Medications:  levalbuterol   Antimicrobials:  Anti-infectives (From admission, onward)    Start     Dose/Rate Route Frequency Ordered Stop   12/03/24 1430  azithromycin   (ZITHROMAX ) tablet 500 mg  Status:  Discontinued       Placed in Followed by Linked Group   500 mg Oral Daily 12/02/24 1418 12/02/24 1521   12/03/24 1000  azithromycin  (ZITHROMAX ) tablet 500 mg        500 mg Oral Daily 12/02/24 1521     12/02/24 1415  azithromycin  (ZITHROMAX ) 500 mg in sodium chloride  0.9 % 250 mL IVPB  Status:  Discontinued       Placed in Followed by Linked Group   500 mg 250 mL/hr over 60 Minutes Intravenous Every 24 hours 12/02/24 1418 12/02/24 1521   12/02/24 1230  doxycycline  (VIBRAMYCIN ) 100 mg in sodium chloride  0.9 % 250 mL IVPB        100 mg 125 mL/hr over 120 Minutes Intravenous  Once 12/02/24 1204     12/02/24 1215  cefTRIAXone  (ROCEPHIN ) 1 g in sodium chloride  0.9 %  100 mL IVPB        1 g 200 mL/hr over 30 Minutes Intravenous  Once 12/02/24 1204 12/02/24 1253           Data Reviewed: I have personally reviewed following labs and imaging studies  CBC: Recent Labs  Lab 12/02/24 0953  WBC 15.7*  HGB 13.9  HCT 43.4  MCV 83.8  PLT 460*   Basic Metabolic Panel: Recent Labs  Lab 12/02/24 0953  NA 137  K 3.7  CL 100  CO2 27  GLUCOSE 166*  BUN 12  CREATININE 0.51  CALCIUM 8.2*   GFR: Estimated Creatinine Clearance: 63.9 mL/min (by C-G formula based on SCr of 0.51 mg/dL). Liver Function Tests: No results for input(s): AST, ALT, ALKPHOS, BILITOT, PROT, ALBUMIN in the last 168 hours. No results for input(s): LIPASE, AMYLASE in the last 168 hours. No results for input(s): AMMONIA in the last 168 hours. Coagulation Profile: No results for input(s): INR, PROTIME in the last 168 hours. Cardiac Enzymes: No results for input(s): CKTOTAL, CKMB, CKMBINDEX, TROPONINI in the last 168 hours. BNP (last 3 results) Recent Labs    12/02/24 1019  PROBNP 338.0*   HbA1C: No results for input(s): HGBA1C in the last 72 hours. CBG: No results for input(s): GLUCAP in the last 168 hours. Lipid Profile: No results  for input(s): CHOL, HDL, LDLCALC, TRIG, CHOLHDL, LDLDIRECT in the last 72 hours. Thyroid Function Tests: No results for input(s): TSH, T4TOTAL, FREET4, T3FREE, THYROIDAB in the last 72 hours. Anemia Panel: No results for input(s): VITAMINB12, FOLATE, FERRITIN, TIBC, IRON, RETICCTPCT in the last 72 hours. Most Recent Urinalysis On File:  No results found for: COLORURINE, APPEARANCEUR, LABSPEC, PHURINE, GLUCOSEU, HGBUR, BILIRUBINUR, KETONESUR, PROTEINUR, UROBILINOGEN, NITRITE, LEUKOCYTESUR Sepsis Labs: @LABRCNTIP (procalcitonin:4,lacticidven:4)  Recent Results (from the past 240 hours)  Resp panel by RT-PCR (RSV, Flu A&B, Covid) Anterior Nasal Swab     Status: None   Collection Time: 12/02/24 10:19 AM   Specimen: Anterior Nasal Swab  Result Value Ref Range Status   SARS Coronavirus 2 by RT PCR NEGATIVE NEGATIVE Final    Comment: (NOTE) SARS-CoV-2 target nucleic acids are NOT DETECTED.  The SARS-CoV-2 RNA is generally detectable in upper respiratory specimens during the acute phase of infection. The lowest concentration of SARS-CoV-2 viral copies this assay can detect is 138 copies/mL. A negative result does not preclude SARS-Cov-2 infection and should not be used as the sole basis for treatment or other patient management decisions. A negative result may occur with  improper specimen collection/handling, submission of specimen other than nasopharyngeal swab, presence of viral mutation(s) within the areas targeted by this assay, and inadequate number of viral copies(<138 copies/mL). A negative result must be combined with clinical observations, patient history, and epidemiological information. The expected result is Negative.  Fact Sheet for Patients:  bloggercourse.com  Fact Sheet for Healthcare Providers:  seriousbroker.it  This test is no t yet approved or cleared by the  [place]  FDA and  has been authorized for detection and/or diagnosis of SARS-CoV-2 by FDA under an Emergency Use Authorization (EUA). This EUA will remain  in effect (meaning this test can be used) for the duration of the COVID-19 declaration under Section 564(b)(1) of the Act, 21 U.S.C.section 360bbb-3(b)(1), unless the authorization is terminated  or revoked sooner.       Influenza A by PCR NEGATIVE NEGATIVE Final   Influenza B by PCR NEGATIVE NEGATIVE Final    Comment: (NOTE) The Xpert Xpress SARS-CoV-2/FLU/RSV plus assay  is intended as an aid in the diagnosis of influenza from Nasopharyngeal swab specimens and should not be used as a sole basis for treatment. Nasal washings and aspirates are unacceptable for Xpert Xpress SARS-CoV-2/FLU/RSV testing.  Fact Sheet for Patients: bloggercourse.com  Fact Sheet for Healthcare Providers: seriousbroker.it  This test is not yet approved or cleared by the [place]  FDA and has been authorized for detection and/or diagnosis of SARS-CoV-2 by FDA under an Emergency Use Authorization (EUA). This EUA will remain in effect (meaning this test can be used) for the duration of the COVID-19 declaration under Section 564(b)(1) of the Act, 21 U.S.C. section 360bbb-3(b)(1), unless the authorization is terminated or revoked.     Resp Syncytial Virus by PCR NEGATIVE NEGATIVE Final    Comment: (NOTE) Fact Sheet for Patients: bloggercourse.com  Fact Sheet for Healthcare Providers: seriousbroker.it  This test is not yet approved or cleared by the [place]  FDA and has been authorized for detection and/or diagnosis of SARS-CoV-2 by FDA under an Emergency Use Authorization (EUA). This EUA will remain in effect (meaning this test can be used) for the duration of the COVID-19 declaration under Section 564(b)(1) of the Act, 21 U.S.C. section  360bbb-3(b)(1), unless the authorization is terminated or revoked.  Performed at [facility], [address], [place], [place] [address]          Radiology Studies: CT CHEST WO CONTRAST Result Date: 12/02/2024 CLINICAL DATA:  Cough, smoker and abnormal chest x-ray. Evaluate for pulmonary emboli. Patient refused IV contrast. EXAM: CT CHEST WITHOUT CONTRAST TECHNIQUE: Multidetector CT imaging of the chest was performed following the standard protocol without IV contrast. RADIATION DOSE REDUCTION: This exam was performed according to the departmental dose-optimization program which includes automated exposure control, adjustment of the mA and/or kV according to patient size and/or use of iterative reconstruction technique. COMPARISON:  Chest x-ray same day. FINDINGS: Cardiovascular: Heart is normal size. Calcified plaque over the left lateral circumflex and right coronary arteries. Thoracic aorta is normal in caliber. Minimal calcified plaque over the descending thoracic aorta. Pulmonary arterial system is not adequately evaluated for emboli without intravenous contrast present. Remaining vascular structures are unremarkable. Mediastinum/Nodes: 1 cm AP window lymph node. 1.1 cm precarinal lymph node. These are likely reactive. No definite hilar adenopathy on this noncontrast exam. Remaining mediastinal structures are unremarkable. Lungs/Pleura: Lungs are adequately inflated. There is a moderate airspace process over the lingula/left upper lobe and right middle lobe with areas of reticulonodular/tree-in-bud opacification as well as consolidation. Subtle patchy reticulonodular opacification over the left lower lobe and medial right lower lobe. More discrete subpleural nodule over the left lower lobe measuring 9 x 13 mm (image 73). These findings are all likely due to an infectious process which may be due to pneumonia versus atypical infection/inflammatory process. No effusion. Airways are normal.  Upper Abdomen: Minimal calcified plaque over the abdominal aorta. No acute findings. Musculoskeletal: Degenerative changes of the spine. Minimal curvature of the upper thoracic spine convex right. No acute findings. IMPRESSION: 1. Moderate bilateral multifocal airspace process worse over the lingula/left upper lobe and right middle lobe with areas of reticulonodular/tree-in-bud opacification as well as consolidation. Findings are likely due to an infectious process which may be bacterial in origin versus atypical infection/inflammatory process. 2. More discrete subpleural nodule over the left lower lobe measuring 9 x 13 mm likely part of the presumed infectious process described above, although recommend re-evaluation on follow-up CT 6-8 weeks post treatment. 3. Aortic atherosclerosis. Atherosclerotic coronary  artery disease. Aortic Atherosclerosis (ICD10-I70.0). Electronically Signed   By: Toribio Agreste M.D.   On: 12/02/2024 11:55   CT Head Wo Contrast Result Date: 12/02/2024 EXAM: CT HEAD WITHOUT CONTRAST 12/02/2024 09:26:00 AM TECHNIQUE: CT of the head was performed without the administration of intravenous contrast. Automated exposure control, iterative reconstruction, and/or weight based adjustment of the mA/kV was utilized to reduce the radiation dose to as low as reasonably achievable. COMPARISON: None available. CLINICAL HISTORY: 69 year old female with altered mental status, former smoker with cough and shortness of breath. FINDINGS: BRAIN AND VENTRICLES: No acute hemorrhage. No evidence of acute infarct. No hydrocephalus. No extra-axial collection. No mass effect or midline shift. Patchy and confluent cystic and encephalomalacia in the right temporal lobe, including the anterior temporal tip, and tracking posteriorly along the right middle temporal gyrus. There is multifocal additional right greater than left inferior frontal gyrus encephalomalacia (coronal images 17, 21). This pattern most resembles  sequelae of remote trauma. Background brain volume is at the lower limits of normal for age. Gray white differentiation is within normal limits. No suspicious intracranial vascular hyperdensity. ORBITS: No acute abnormality. SINUSES: Mild bubbly opacity in the right sphenoid sinus. Other paranasal sinuses, tympanic cavities and mastoids are well aerated. SOFT TISSUES AND SKULL: No acute soft tissue abnormality. No skull fracture. IMPRESSION: 1. No acute intracranial abnormality. 2. 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 Electronically signed by: Helayne Hurst MD MD 12/02/2024 09:31 AM EST RP Workstation: [id number]   DG Chest 2 View Result Date: 12/02/2024 EXAM: 2 VIEW(S) XRAY OF THE CHEST 12/02/2024 09:22:00 AM COMPARISON: None available. CLINICAL HISTORY: 69 year old female with shortness of breath and chronic obstructive pulmonary disease. FINDINGS: LUNGS AND PLEURA: Large lung volumes, hyperinflated anterior upper lobe architectural distortion on the lateral view. Left mid lung 1.2 cm nodule. Lingular alveolar opacity consistent with pneumonia versus volume loss. COPD. Recommend follow up CT chest. No pleural effusion. No pneumothorax. HEART AND MEDIASTINUM: No acute abnormality of the cardiac and mediastinal silhouettes. BONES AND SOFT TISSUES: Osteopenia. Thoracic degenerative changes. No acute osseous abnormality. IMPRESSION: 1. Evidence of chronic hyperinflation, COPD. Anterior upper lobe architectural distortion, and separate left lung 1.2 cm nodule, are indeterminate. Recommend follow-up CT Chest (IV contrast and routine protocol preferred). Electronically signed by: Helayne Hurst MD MD 12/02/2024 09:27 AM EST RP Workstation: [id number]             LOS: 0 days    Laneta Blunt, DO [facility] 12/02/2024, 3:41 PM    Dictation software may have been used to generate the above note. Typos may occur and escape review in  typed/dictated notes. Please contact Dr Blunt directly for clarity if needed.  Staff may message me via secure chat in Epic  but this may not receive an immediate response,  please page me for urgent matters!  If 7PM-7AM, please contact night coverage [email or web address]          [1]  No current facility-administered medications on file prior to encounter.   Current Outpatient Medications on File Prior to Encounter  Medication Sig Dispense Refill   acetaminophen  (TYLENOL ) 500 MG tablet Take 500 mg by mouth every 6 (six) hours as needed.     levalbuterol  (XOPENEX  HFA) 45 MCG/ACT inhaler Inhale 1-2 puffs into the lungs every 4 (four) hours as needed for wheezing. 1 each 12   ondansetron  (ZOFRAN -ODT) 4 MG disintegrating tablet Take 1 tablet (4 mg total) by mouth every 8 (eight) hours as needed for  nausea or vomiting. 20 tablet 0   ferrous sulfate 325 (65 FE) MG tablet Take 325 mg by mouth 2 (two) times daily with a meal. (Patient not taking: Reported on 12/02/2024)     "

## 2024-12-02 NOTE — ED Provider Notes (Signed)
 "  [facility] Provider Note    Event Date/Time   First MD Initiated Contact with Patient 12/02/24 [phone]     (approximate)   History   Altered Mental Status   HPI  Cynthia Shelton is a 69 y.o. female diabetes, macrocytic anemia, previously smoking quit last week who presents with 1 week of progressively worsening shortness of breath and cough.  Patient states that she feels as if she is in a brain fog and has become increasingly confused.  Does not have a diagnosis of COPD previously.  Does not use oxygen at home and does not have any nebulizing treatments or albuterol  at home.  There is a cough and is productive of green sputum.  Denies any weakness, extremity numbness sensation changes, abdominal pain changes in urinary or bowel habits      Physical Exam   Triage Vital Signs: ED Triage Vitals [12/02/24 0905]  Encounter Vitals Group     BP 135/81     Girls Systolic BP Percentile      Girls Diastolic BP Percentile      Boys Systolic BP Percentile      Boys Diastolic BP Percentile      Pulse Rate 96     Resp 20     Temp 98.7 F (37.1 C)     Temp src      SpO2 90 %     Weight 163 lb (73.9 kg)     Height 5' 3 (1.6 m)     Head Circumference      Peak Flow      Pain Score      Pain Loc      Pain Education      Exclude from Growth Chart     Most recent vital signs: Vitals:   12/02/24 1100 12/02/24 1300  BP: 139/89 (!) 141/97  Pulse: 95 99  Resp: (!) 24 18  Temp:  98.4 F (36.9 C)  SpO2: 98% 93%    Nursing Triage Note reviewed. Vital signs reviewed and patients oxygen saturation is hypoxic on 3L   General: Patient is well nourished, well developed, awake and alert, resting comfortably in no acute distress Head: Normocephalic and atraumatic Eyes: Normal inspection, extraocular muscles intact, no conjunctival pallor Ear, nose, throat: Normal external exam Neck: Normal range of motion Respiratory: Patient is in moderate respiratory  distress, lungs with copious wheezes Cardiovascular: Patient is tachycardic, RR GI: Abd SNT with no guarding or rebound  Back: Normal inspection of the back with good strength and range of motion throughout all ext Extremities: pulses intact with good cap refills, no LE pitting edema or calf tenderness Neuro: The patient is alert and oriented to person, place, and time, appropriately conversive, with 5/5 bilat UE/LE strength, no gross motor or sensory defects noted. Coordination appears to be adequate. Skin: Warm, dry, and intact Psych: odd affect,  normal mood, no SI or HI  ED Results / Procedures / Treatments   Labs (all labs ordered are listed, but only abnormal results are displayed) Labs Reviewed  BASIC METABOLIC PANEL WITH GFR - Abnormal; Notable for the following components:      Result Value   Glucose, Bld 166 (*)    Calcium 8.2 (*)    All other components within normal limits  CBC - Abnormal; Notable for the following components:   WBC 15.7 (*)    RBC 5.18 (*)    Platelets 460 (*)    All other components  within normal limits  PRO BRAIN NATRIURETIC PEPTIDE - Abnormal; Notable for the following components:   Pro Brain Natriuretic Peptide 338.0 (*)    All other components within normal limits  BLOOD GAS, VENOUS - Abnormal; Notable for the following components:   pO2, Ven 31 (*)    Bicarbonate 30.5 (*)    Acid-Base Excess 5.1 (*)    All other components within normal limits  RESP PANEL BY RT-PCR (RSV, FLU A&B, COVID)  RVPGX2  CULTURE, BLOOD (ROUTINE X 2)  CULTURE, BLOOD (ROUTINE X 2)  LACTIC ACID, PLASMA     EKG EKG and rhythm strip are interpreted by myself:   EKG: [Normal sinus rhythm] at heart rate of 96, normal QRS duration, QTc 432, nonspecific ST segments and T waves no ectopy EKG not consistent with Acute STEMI Rhythm strip: tachycardia in lead II   RADIOLOGY CT head: No intracranial hemorrhage on my independent review interpretation radiologist  agrees Chest x-ray: Possible concerning for opacities on my independent review interpretation radiologist agrees CT chest without contrast: Concerning for pneumonia per radiologist read    PROCEDURES:  Critical Care performed: No  Procedures   MEDICATIONS ORDERED IN ED: Medications  methylPREDNISolone  sodium succinate (SOLU-MEDROL ) 125 mg/2 mL injection 125 mg (125 mg Intravenous Not Given 12/02/24 1022)  ondansetron  (ZOFRAN ) injection 4 mg (4 mg Intravenous Not Given 12/02/24 1022)  famotidine  (PEPCID ) IVPB 20 mg premix (20 mg Intravenous Not Given 12/02/24 1022)  doxycycline  (VIBRAMYCIN ) 100 mg in sodium chloride  0.9 % 250 mL IVPB (has no administration in time range)  ipratropium-albuterol  (DUONEB) 0.5-2.5 (3) MG/3ML nebulizer solution 3 mL (3 mLs Nebulization Given 12/02/24 1011)  ipratropium-albuterol  (DUONEB) 0.5-2.5 (3) MG/3ML nebulizer solution 3 mL (3 mLs Nebulization Given 12/02/24 1010)  cefTRIAXone  (ROCEPHIN ) 1 g in sodium chloride  0.9 % 100 mL IVPB (0 g Intravenous Stopped 12/02/24 1253)     IMPRESSION / MDM / ASSESSMENT AND PLAN / ED COURSE                                Differential diagnosis includes, but is not limited to, bronchospasm, URI, COPD, hypercarbia, anemia, electrolyte derangement, CHF  ED course: Patient presents and she is requiring 2 to 3 L of oxygen to maintain oxygen saturation.  She does have a leukocytosis but no profound electrolyte derangements.  Blood gas demonstrated no hypercarbia or acidemia.  BNP is not significantly elevated and COVID panel is unremarkable.  She was given 2 DuoNebs in succession with improvement of her symptoms.  Patient declined any IV steroids despite multiple discussions.  Patient declined CT chest with IV contrast but this without contrast did demonstrate possible pneumonia and I have initiated her on ceftriaxone  and doxycycline .  Case discussed with hospitalist for admission   Clinical Course as of 12/02/24 1302  Thu Dec 02, 2024  1017 I reviewed the indications benefits risks and alternatives with the patient multiple occasions for doing Solu-Medrol  which is the best medication that we have at this time for her COPD exacerbation/bronchospasm.  Patient was initially amenable to it but has changed her mind.  I do think she has the capacity to make this decision.  She states that she is fearful that it will cause her to be increasingly confused which is understandable. [HD]  1109 Also notified by nursing that patient declined contrast portion of CT chest [HD]  1238 Case discussed with the hospitalist.  [HD]  Clinical Course User Index [HD] Nicholaus Rolland BRAVO, MD   -- Risk: 5 This patient has a high risk of morbidity due to further diagnostic testing or treatment. Rationale: This patients evaluation and management involve a high risk of morbidity due to the potential severity of presenting symptoms, need for diagnostic testing, and/or initiation of treatment that may require close monitoring. The differential includes conditions with potential for significant deterioration or requiring escalation of care. Treatment decisions in the ED, including medication administration, procedural interventions, or disposition planning, reflect this level of risk. COPA: 5 The patient has the following acute or chronic illness/injury that poses a possible threat to life or bodily function: [X] : The patient has a potentially serious acute condition or an acute exacerbation of a chronic illness requiring urgent evaluation and management in the Emergency Department. The clinical presentation necessitates immediate consideration of life-threatening or function-threatening diagnoses, even if they are ultimately ruled out.   FINAL CLINICAL IMPRESSION(S) / ED DIAGNOSES   Final diagnoses:  Hypoxia  Pneumonia due to infectious organism, unspecified laterality, unspecified part of lung  Bronchospasm     Rx / DC Orders   ED Discharge  Orders     None        Note:  This document was prepared using Dragon voice recognition software and may include unintentional dictation errors.   Nicholaus Rolland BRAVO, MD 12/02/24 1302  "

## 2024-12-03 ENCOUNTER — Other Ambulatory Visit: Payer: Self-pay

## 2024-12-03 ENCOUNTER — Encounter: Payer: Self-pay | Admitting: Oncology

## 2024-12-03 DIAGNOSIS — J441 Chronic obstructive pulmonary disease with (acute) exacerbation: Secondary | ICD-10-CM | POA: Diagnosis not present

## 2024-12-03 LAB — BLOOD CULTURE ID PANEL (REFLEXED) - BCID2

## 2024-12-03 MED ORDER — AZITHROMYCIN 500 MG PO TABS
500.0000 mg | ORAL_TABLET | Freq: Every day | ORAL | 0 refills | Status: AC
  Filled 2024-12-03: qty 3, 3d supply, fill #0

## 2024-12-03 MED ORDER — ADULT MULTIVITAMIN W/MINERALS CH: 1.0000 | ORAL_TABLET | Freq: Every day | ORAL | Status: DC

## 2024-12-03 MED ORDER — CEFDINIR 300 MG PO CAPS
300.0000 mg | ORAL_CAPSULE | Freq: Two times a day (BID) | ORAL | 0 refills | Status: AC
  Filled 2024-12-03: qty 10, 5d supply, fill #0

## 2024-12-03 MED ORDER — BUDESONIDE-FORMOTEROL FUMARATE 80-4.5 MCG/ACT IN AERO
2.0000 | INHALATION_SPRAY | Freq: Two times a day (BID) | RESPIRATORY_TRACT | 0 refills | Status: AC
  Filled 2024-12-03: qty 10.2, 30d supply, fill #0

## 2024-12-03 NOTE — Plan of Care (Signed)

## 2024-12-03 NOTE — Discharge Summary (Signed)
 "    Physician Discharge Summary   Patient: Cynthia Shelton MRN: [id number]  DOB: 1956/08/18   Admit:     Date of Admission: 12/02/2024 Admitted from: home   Discharge: Date of discharge: 12/03/2024 Disposition: Home Condition at discharge: good  CODE STATUS: DNR     Discharge Physician: Cynthia Blunt, DO [facility]     PCP: Cynthia Amis, MD  Recommendations for Outpatient Follow-up:  Follow up with PCP Cynthia Amis, MD in 1-2 weeks Please obtain labs/tests: PFT, will need repeat CT chest to follow lung nodule     Discharge Instructions      Treated for: COPD / pneumonia, possible asthma  Rx for: Oxygen at least temporarily - continuous use pending follow up with PCP Antibiotics to cover pneumonia  Inhalers - maintenance symbicort  + as needed levalbuterol  Note you have declined oral steroids due to previous reaction, prednisone has been added to your allergy/reaction list, but you have tolerated inhaled steroids ok  Follow up: See PCP ASAP  Repeat CT in about 6 weeks to follow up on lung nodule Symptoms and imaging fit w/ COPD, recommend lung function testing to confirm dx / severity  Recommend repeat oxygen assessment to determine if supplemental oxygen will be needed indefinitely  If worsening shortness of breath or other concerns please seek emergency medical care ASAP         Discharge Diagnoses: Principal Problem:   COPD exacerbation [facility])    Hospital course / significant events:   Cynthia Shelton is a 69 y.o. female with past medical history of prediabetes, remote history of DVT in 2012, iron deficiency anemia, history of tobacco use recently quit, she presents to ED on 07/14/2025 with chief complaint shortness of breath, cough.  Symptoms have been ongoing couple of weeks, initially seen in urgent care 11/20/2024 and treated for URI, prescribed doxycycline  as well as Xopenex  inhaler.  Reportedly patient also had norovirus  diagnosis not long after that.  Was feeling some better but then shortness of breath worsening again, presented to the ED today.  Denies fever/chills, reports cough is generating clear mucus.  In the ED, new oxygen requirement SpO2 90% on room air and improved to 93 to 98% on 3 L nasal cannula.  CT chest, patient declined a CT angio with contrast, CT demonstrated moderate bilateral multifocal airspace process worse over lingula/LUL and RML, question infectious process versus atypical infection/inflammatory process.  Also noted subpleural nodule left lower lobe will need follow-up CT in 6 to 8 weeks.  Admitted to hospitalist service for COPD exacerbation/community-acquired pneumonia. Decliend systemic steroids but tolerated ICS nebs, some desat O2 but pt walking around nurses station on O2 without SOB, home O2 arranged. Dc on abx, ICS/LABA, SABA for rescue, suspect COPD/asthma. Also sent w/ azithro and cefdinir  for CAP, will need close follow up     Consultants:  none  Procedures/Surgeries: none      ASSESSMENT & PLAN:    Community-acquired pneumonia -sepsis has been ruled out Finish out 7 days total cephalosporin + 5 days total azithromycin    No formal diagnosis COPD but clinically suspect COPD exacerbation Jitteriness with albuterol , continue levalbuterol  as needed Ipratropium and budesonide  nebs scheduled --> symbicot on discharge  Patient declines systemic steroids due to history of poor reactions with this in the past   Acute hypoxic respiratory failure due to CAP/COPD O2 supplementation to titrate up as needed, taper off as able --> sent home on continuous 2L O2 [place] Treat underlying causes as  noted   History of tobacco use disorder, currently in remission Encourage continued abstinence from tobacco products   Lung nodule See CT report, may need follow-up in about 6 weeks       overweight based on BMI: Body mass index is 28.87 kg/m.Cynthia Shelton Significantly low or high BMI is  associated with higher medical risk.  Underweight - under 18  overweight - 25 to 29 obese - 30 or more Class 1 obesity: BMI of 30.0 to 34 Class 2 obesity: BMI of 35.0 to 39 Class 3 obesity: BMI of 40.0 to 49 Super Morbid Obesity: BMI 50-59 Super-super Morbid Obesity: BMI 60+ Healthy nutrition and physical activity advised as adjunct to other disease management and risk reduction treatments         Discharge Instructions  Allergies as of 12/03/2024       Reactions   Amoxicillin-pot Clavulanate Nausea And Vomiting   Ciprofloxacin Other (See Comments)   Altered mental status   Prednisone Other (See Comments)   Severe confusion   Minocycline Other (See Comments), Rash, Dermatitis   thrush Other Reaction(s): Other (See Comments),Rash thrush  thrush        Medication List     TAKE these medications    acetaminophen  500 MG tablet Commonly known as: TYLENOL  Take 500 mg by mouth every 6 (six) hours as needed.   azithromycin  500 MG tablet Commonly known as: ZITHROMAX  Take 1 tablet (500 mg total) by mouth daily for 3 days. Start taking on: December 04, 2024   budesonide -formoterol  80-4.5 MCG/ACT inhaler Commonly known as: Symbicort  Inhale 2 puffs into the lungs in the morning and at bedtime.   cefdinir  300 MG capsule Commonly known as: OMNICEF  Take 1 capsule (300 mg total) by mouth 2 (two) times daily for 5 days. Start taking on: December 04, 2024   ferrous sulfate 325 (65 FE) MG tablet Take 325 mg by mouth 2 (two) times daily with a meal.   levalbuterol  45 MCG/ACT inhaler Commonly known as: Xopenex  HFA Inhale 1-2 puffs into the lungs every 4 (four) hours as needed for wheezing.   ondansetron  4 MG disintegrating tablet Commonly known as: ZOFRAN -ODT Take 1 tablet (4 mg total) by mouth every 8 (eight) hours as needed for nausea or vomiting.               Durable Medical Equipment  (From admission, onward)           Start     Ordered   12/03/24  1436  For home use only DME oxygen  Once       Question Answer Comment  Length of Need 6 Months   Mode or (Route) Nasal cannula   Liters per Minute 2   Frequency Continuous (stationary and portable oxygen unit needed)   Oxygen delivery system: Gas      12/03/24 1436             Follow-up Information     Cynthia Amis, MD. Schedule an appointment as soon as possible for a visit.   Specialty: Family Medicine Why: hosptial follow up asap - COPD/asthma exacerbation, lung nodule will need follow up, recs PFT +/- sleep study Contact information: [address] [facility] [address] [facility] [place] [address] [phone]                 Allergies[1]   Subjective: pt reports some SOB w ambulation better w/ O2. Mild cough. No SOB / CP at rest, no CP on exertion. Tolerating  diet.. ambulating independently    Discharge Exam: BP 126/67 (BP Location: Right Arm)   Pulse 75   Temp 97.6 F (36.4 C)   Resp 16   Ht 5' 3 (1.6 m)   Wt 73.9 kg   SpO2 93%   BMI 28.87 kg/m  General: Pt is alert, awake, not in acute distress Cardiovascular: RRR, S1/S2 +, no rubs, no gallops Respiratory: +wheezing improved from previous, no rhonchi Abdominal: Soft, NT, ND, bowel sounds + Extremities: no edema, no cyanosis     The results of significant diagnostics from this hospitalization (including imaging, microbiology, ancillary and laboratory) are listed below for reference.     Microbiology: Recent Results (from the past 240 hours)  Resp panel by RT-PCR (RSV, Flu A&B, Covid) Anterior Nasal Swab     Status: None   Collection Time: 12/02/24 10:19 AM   Specimen: Anterior Nasal Swab  Result Value Ref Range Status   SARS Coronavirus 2 by RT PCR NEGATIVE NEGATIVE Final    Comment: (NOTE) SARS-CoV-2 target nucleic acids are NOT DETECTED.  The SARS-CoV-2 RNA is generally detectable in upper respiratory specimens during the acute phase of infection. The lowest concentration of  SARS-CoV-2 viral copies this assay can detect is 138 copies/mL. A negative result does not preclude SARS-Cov-2 infection and should not be used as the sole basis for treatment or other patient management decisions. A negative result may occur with  improper specimen collection/handling, submission of specimen other than nasopharyngeal swab, presence of viral mutation(s) within the areas targeted by this assay, and inadequate number of viral copies(<138 copies/mL). A negative result must be combined with clinical observations, patient history, and epidemiological information. The expected result is Negative.  Fact Sheet for Patients:  bloggercourse.com  Fact Sheet for Healthcare Providers:  seriousbroker.it  This test is no t yet approved or cleared by the [place]  FDA and  has been authorized for detection and/or diagnosis of SARS-CoV-2 by FDA under an Emergency Use Authorization (EUA). This EUA will remain  in effect (meaning this test can be used) for the duration of the COVID-19 declaration under Section 564(b)(1) of the Act, 21 U.S.C.section 360bbb-3(b)(1), unless the authorization is terminated  or revoked sooner.       Influenza A by PCR NEGATIVE NEGATIVE Final   Influenza B by PCR NEGATIVE NEGATIVE Final    Comment: (NOTE) The Xpert Xpress SARS-CoV-2/FLU/RSV plus assay is intended as an aid in the diagnosis of influenza from Nasopharyngeal swab specimens and should not be used as a sole basis for treatment. Nasal washings and aspirates are unacceptable for Xpert Xpress SARS-CoV-2/FLU/RSV testing.  Fact Sheet for Patients: bloggercourse.com  Fact Sheet for Healthcare Providers: seriousbroker.it  This test is not yet approved or cleared by the [place]  FDA and has been authorized for detection and/or diagnosis of SARS-CoV-2 by FDA under an Emergency Use  Authorization (EUA). This EUA will remain in effect (meaning this test can be used) for the duration of the COVID-19 declaration under Section 564(b)(1) of the Act, 21 U.S.C. section 360bbb-3(b)(1), unless the authorization is terminated or revoked.     Resp Syncytial Virus by PCR NEGATIVE NEGATIVE Final    Comment: (NOTE) Fact Sheet for Patients: bloggercourse.com  Fact Sheet for Healthcare Providers: seriousbroker.it  This test is not yet approved or cleared by the [place]  FDA and has been authorized for detection and/or diagnosis of SARS-CoV-2 by FDA under an Emergency Use Authorization (EUA). This EUA will remain in effect (meaning this test  can be used) for the duration of the COVID-19 declaration under Section 564(b)(1) of the Act, 21 U.S.C. section 360bbb-3(b)(1), unless the authorization is terminated or revoked.  Performed at [facility], [address] [place], [place] [address]   Blood culture (routine x 2)     Status: None (Preliminary result)   Collection Time: 12/02/24 12:32 PM   Specimen: BLOOD  Result Value Ref Range Status   Specimen Description BLOOD BLOOD RIGHT ARM  Final   Special Requests   Final    BOTTLES DRAWN AEROBIC AND ANAEROBIC Blood Culture results may not be optimal due to an inadequate volume of blood received in culture bottles   Culture  Setup Time   Final    GRAM POSITIVE COCCI AEROBIC BOTTLE ONLY Organism ID to follow Performed at [facility], [address] [place], [place] [address]    Culture GRAM POSITIVE COCCI  Final   Report Status PENDING  Incomplete  Blood culture (routine x 2)     Status: None (Preliminary result)   Collection Time: 12/02/24  8:32 PM   Specimen: BLOOD  Result Value Ref Range Status   Specimen Description BLOOD BLOOD RIGHT ARM  Final   Special Requests   Final    BOTTLES DRAWN AEROBIC AND ANAEROBIC Blood Culture adequate volume    Culture   Final    NO GROWTH < 12 HOURS Performed at [facility], [address] [place], [place] [address]    Report Status PENDING  Incomplete     Labs: BNP (last 3 results) No results for input(s): BNP in the last 8760 hours. Basic Metabolic Panel: Recent Labs  Lab 12/02/24 0953  NA 137  K 3.7  CL 100  CO2 27  GLUCOSE 166*  BUN 12  CREATININE 0.51  CALCIUM 8.2*   Liver Function Tests: No results for input(s): AST, ALT, ALKPHOS, BILITOT, PROT, ALBUMIN in the last 168 hours. No results for input(s): LIPASE, AMYLASE in the last 168 hours. No results for input(s): AMMONIA in the last 168 hours. CBC: Recent Labs  Lab 12/02/24 0953 12/02/24 2032  WBC 15.7* 13.8*  HGB 13.9 12.7  HCT 43.4 38.7  MCV 83.8 82.0  PLT 460* 484*   Cardiac Enzymes: No results for input(s): CKTOTAL, CKMB, CKMBINDEX, TROPONINI in the last 168 hours. BNP: Invalid input(s): POCBNP CBG: No results for input(s): GLUCAP in the last 168 hours. D-Dimer No results for input(s): DDIMER in the last 72 hours. Hgb A1c No results for input(s): HGBA1C in the last 72 hours. Lipid Profile No results for input(s): CHOL, HDL, LDLCALC, TRIG, CHOLHDL, LDLDIRECT in the last 72 hours. Thyroid function studies No results for input(s): TSH, T4TOTAL, T3FREE, THYROIDAB in the last 72 hours.  Invalid input(s): FREET3 Anemia work up No results for input(s): VITAMINB12, FOLATE, FERRITIN, TIBC, IRON, RETICCTPCT in the last 72 hours. Urinalysis No results found for: COLORURINE, APPEARANCEUR, LABSPEC, PHURINE, GLUCOSEU, HGBUR, BILIRUBINUR, KETONESUR, PROTEINUR, UROBILINOGEN, NITRITE, LEUKOCYTESUR Sepsis Labs Recent Labs  Lab 12/02/24 9046 12/02/24 2032  WBC 15.7* 13.8*   Microbiology Recent Results (from the past 240 hours)  Resp panel by RT-PCR (RSV, Flu A&B, Covid) Anterior Nasal Swab     Status: None    Collection Time: 12/02/24 10:19 AM   Specimen: Anterior Nasal Swab  Result Value Ref Range Status   SARS Coronavirus 2 by RT PCR NEGATIVE NEGATIVE Final    Comment: (NOTE) SARS-CoV-2 target nucleic acids are NOT DETECTED.  The SARS-CoV-2 RNA is  generally detectable in upper respiratory specimens during the acute phase of infection. The lowest concentration of SARS-CoV-2 viral copies this assay can detect is 138 copies/mL. A negative result does not preclude SARS-Cov-2 infection and should not be used as the sole basis for treatment or other patient management decisions. A negative result may occur with  improper specimen collection/handling, submission of specimen other than nasopharyngeal swab, presence of viral mutation(s) within the areas targeted by this assay, and inadequate number of viral copies(<138 copies/mL). A negative result must be combined with clinical observations, patient history, and epidemiological information. The expected result is Negative.  Fact Sheet for Patients:  bloggercourse.com  Fact Sheet for Healthcare Providers:  seriousbroker.it  This test is no t yet approved or cleared by the [place]  FDA and  has been authorized for detection and/or diagnosis of SARS-CoV-2 by FDA under an Emergency Use Authorization (EUA). This EUA will remain  in effect (meaning this test can be used) for the duration of the COVID-19 declaration under Section 564(b)(1) of the Act, 21 U.S.C.section 360bbb-3(b)(1), unless the authorization is terminated  or revoked sooner.       Influenza A by PCR NEGATIVE NEGATIVE Final   Influenza B by PCR NEGATIVE NEGATIVE Final    Comment: (NOTE) The Xpert Xpress SARS-CoV-2/FLU/RSV plus assay is intended as an aid in the diagnosis of influenza from Nasopharyngeal swab specimens and should not be used as a sole basis for treatment. Nasal washings and aspirates are unacceptable for  Xpert Xpress SARS-CoV-2/FLU/RSV testing.  Fact Sheet for Patients: bloggercourse.com  Fact Sheet for Healthcare Providers: seriousbroker.it  This test is not yet approved or cleared by the [place]  FDA and has been authorized for detection and/or diagnosis of SARS-CoV-2 by FDA under an Emergency Use Authorization (EUA). This EUA will remain in effect (meaning this test can be used) for the duration of the COVID-19 declaration under Section 564(b)(1) of the Act, 21 U.S.C. section 360bbb-3(b)(1), unless the authorization is terminated or revoked.     Resp Syncytial Virus by PCR NEGATIVE NEGATIVE Final    Comment: (NOTE) Fact Sheet for Patients: bloggercourse.com  Fact Sheet for Healthcare Providers: seriousbroker.it  This test is not yet approved or cleared by the [place]  FDA and has been authorized for detection and/or diagnosis of SARS-CoV-2 by FDA under an Emergency Use Authorization (EUA). This EUA will remain in effect (meaning this test can be used) for the duration of the COVID-19 declaration under Section 564(b)(1) of the Act, 21 U.S.C. section 360bbb-3(b)(1), unless the authorization is terminated or revoked.  Performed at [facility], [address] [place], [place] [address]   Blood culture (routine x 2)     Status: None (Preliminary result)   Collection Time: 12/02/24 12:32 PM   Specimen: BLOOD  Result Value Ref Range Status   Specimen Description BLOOD BLOOD RIGHT ARM  Final   Special Requests   Final    BOTTLES DRAWN AEROBIC AND ANAEROBIC Blood Culture results may not be optimal due to an inadequate volume of blood received in culture bottles   Culture  Setup Time   Final    GRAM POSITIVE COCCI AEROBIC BOTTLE ONLY Organism ID to follow Performed at [facility], [address] [place], [place] [address]    Culture [facility]  POSITIVE COCCI  Final   Report Status PENDING  Incomplete  Blood culture (routine x 2)     Status: None (Preliminary result)   Collection Time: 12/02/24  8:32 PM  Specimen: BLOOD  Result Value Ref Range Status   Specimen Description BLOOD BLOOD RIGHT ARM  Final   Special Requests   Final    BOTTLES DRAWN AEROBIC AND ANAEROBIC Blood Culture adequate volume   Culture   Final    NO GROWTH < 12 HOURS Performed at [facility], [address], [place], [place] [address]    Report Status PENDING  Incomplete   Imaging CT CHEST WO CONTRAST Result Date: 12/02/2024 CLINICAL DATA:  Cough, smoker and abnormal chest x-ray. Evaluate for pulmonary emboli. Patient refused IV contrast. EXAM: CT CHEST WITHOUT CONTRAST TECHNIQUE: Multidetector CT imaging of the chest was performed following the standard protocol without IV contrast. RADIATION DOSE REDUCTION: This exam was performed according to the departmental dose-optimization program which includes automated exposure control, adjustment of the mA and/or kV according to patient size and/or use of iterative reconstruction technique. COMPARISON:  Chest x-ray same day. FINDINGS: Cardiovascular: Heart is normal size. Calcified plaque over the left lateral circumflex and right coronary arteries. Thoracic aorta is normal in caliber. Minimal calcified plaque over the descending thoracic aorta. Pulmonary arterial system is not adequately evaluated for emboli without intravenous contrast present. Remaining vascular structures are unremarkable. Mediastinum/Nodes: 1 cm AP window lymph node. 1.1 cm precarinal lymph node. These are likely reactive. No definite hilar adenopathy on this noncontrast exam. Remaining mediastinal structures are unremarkable. Lungs/Pleura: Lungs are adequately inflated. There is a moderate airspace process over the lingula/left upper lobe and right middle lobe with areas of reticulonodular/tree-in-bud opacification as well as consolidation.  Subtle patchy reticulonodular opacification over the left lower lobe and medial right lower lobe. More discrete subpleural nodule over the left lower lobe measuring 9 x 13 mm (image 73). These findings are all likely due to an infectious process which may be due to pneumonia versus atypical infection/inflammatory process. No effusion. Airways are normal. Upper Abdomen: Minimal calcified plaque over the abdominal aorta. No acute findings. Musculoskeletal: Degenerative changes of the spine. Minimal curvature of the upper thoracic spine convex right. No acute findings. IMPRESSION: 1. Moderate bilateral multifocal airspace process worse over the lingula/left upper lobe and right middle lobe with areas of reticulonodular/tree-in-bud opacification as well as consolidation. Findings are likely due to an infectious process which may be bacterial in origin versus atypical infection/inflammatory process. 2. More discrete subpleural nodule over the left lower lobe measuring 9 x 13 mm likely part of the presumed infectious process described above, although recommend re-evaluation on follow-up CT 6-8 weeks post treatment. 3. Aortic atherosclerosis. Atherosclerotic coronary artery disease. Aortic Atherosclerosis (ICD10-I70.0). Electronically Signed   By: Toribio Agreste M.D.   On: 12/02/2024 11:55   CT Head Wo Contrast Result Date: 12/02/2024 EXAM: CT HEAD WITHOUT CONTRAST 12/02/2024 09:26:00 AM TECHNIQUE: CT of the head was performed without the administration of intravenous contrast. Automated exposure control, iterative reconstruction, and/or weight based adjustment of the mA/kV was utilized to reduce the radiation dose to as low as reasonably achievable. COMPARISON: None available. CLINICAL HISTORY: 68 year old female with altered mental status, former smoker with cough and shortness of breath. FINDINGS: BRAIN AND VENTRICLES: No acute hemorrhage. No evidence of acute infarct. No hydrocephalus. No extra-axial collection. No  mass effect or midline shift. Patchy and confluent cystic and encephalomalacia in the right temporal lobe, including the anterior temporal tip, and tracking posteriorly along the right middle temporal gyrus. There is multifocal additional right greater than left inferior frontal gyrus encephalomalacia (coronal images 17, 21). This pattern most resembles sequelae of remote  trauma. Background brain volume is at the lower limits of normal for age. Gray white differentiation is within normal limits. No suspicious intracranial vascular hyperdensity. ORBITS: No acute abnormality. SINUSES: Mild bubbly opacity in the right sphenoid sinus. Other paranasal sinuses, tympanic cavities and mastoids are well aerated. SOFT TISSUES AND SKULL: No acute soft tissue abnormality. No skull fracture. IMPRESSION: 1. No acute intracranial abnormality. 2. 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 Electronically signed by: Helayne Hurst MD MD 12/02/2024 09:31 AM EST RP Workstation: [id number]   DG Chest 2 View Result Date: 12/02/2024 EXAM: 2 VIEW(S) XRAY OF THE CHEST 12/02/2024 09:22:00 AM COMPARISON: None available. CLINICAL HISTORY: 69 year old female with shortness of breath and chronic obstructive pulmonary disease. FINDINGS: LUNGS AND PLEURA: Large lung volumes, hyperinflated anterior upper lobe architectural distortion on the lateral view. Left mid lung 1.2 cm nodule. Lingular alveolar opacity consistent with pneumonia versus volume loss. COPD. Recommend follow up CT chest. No pleural effusion. No pneumothorax. HEART AND MEDIASTINUM: No acute abnormality of the cardiac and mediastinal silhouettes. BONES AND SOFT TISSUES: Osteopenia. Thoracic degenerative changes. No acute osseous abnormality. IMPRESSION: 1. Evidence of chronic hyperinflation, COPD. Anterior upper lobe architectural distortion, and separate left lung 1.2 cm nodule, are indeterminate. Recommend follow-up CT Chest (IV  contrast and routine protocol preferred). Electronically signed by: Helayne Hurst MD MD 12/02/2024 09:27 AM EST RP Workstation: [id number]      Time coordinating discharge: over 30 minutes  SIGNED:  Laneta Blunt DO [facility]       [1]  Allergies Allergen Reactions   Amoxicillin-Pot Clavulanate Nausea And Vomiting   Ciprofloxacin Other (See Comments)    Altered mental status   Prednisone Other (See Comments)    Severe confusion   Minocycline Other (See Comments), Rash and Dermatitis    thrush  Other Reaction(s): Other (See Comments),Rash  thrush  thrush   "

## 2024-12-03 NOTE — Progress Notes (Signed)
 SATURATION QUALIFICATIONS: (This note is used to comply with regulatory documentation for home oxygen)  Patient Saturations on Room Air at Rest = 90%  Patient Saturations on Room Air while Ambulating = 87%  Patient Saturations on 2 Liters of oxygen while Ambulating = 91%  Please briefly explain why patient needs home oxygen: Patient desaturates < 88% when ambulating on room air and is sob. She will benefit from supplemental O2 upon return home.   Jaryn Hocutt, PT, GCS 12/03/2024,9:59 AM

## 2024-12-03 NOTE — Care Management Obs Status (Signed)
 MEDICARE OBSERVATION STATUS NOTIFICATION   Patient Details  Name: GARLAND SMOUSE MRN: [id number] Date of Birth: 05-29-1956   Medicare Observation Status Notification Given:  Yes    Rojelio SHAUNNA Rattler 12/03/2024, 2:55 PM

## 2024-12-03 NOTE — TOC Transition Note (Signed)
 Transition of Care [facility]) - Discharge Note   Patient Details  Name: Cynthia Shelton MRN: [id number] Date of Birth: 09/17/1956  [facility]) CM/SW Contact:  Alvaro Louder, LCSW Phone Number: 12/03/2024, 3:10 PM   Clinical Narrative:   LCSWA confirmed with MD that patient is stable for discharge. LCSWA notified the patient and they are in agreement with discharge. The patient reported that he would have family come pick her up at discharge. LCSWA discussed PT recommendation  of  home O2 Patient was agreeable, LCSWA reached out to [employer] coordinator to set up equipment delivery.  TOC signing off    Final next level of care: Home/Self Care Barriers to Discharge: No Barriers Identified   Patient Goals and CMS Choice            Discharge Placement                Patient to be transferred to facility by: Family Name of family member notified: Self Patient and family notified of of transfer: 12/03/24  Discharge Plan and Services Additional resources added to the After Visit Summary for                  DME Arranged: Oxygen DME Agency: [employer] Date DME Agency Contacted: 12/03/24   Representative spoke with at DME Agency: Thomasina            Social Drivers of Health (SDOH) Interventions SDOH Screenings   Food Insecurity: No Food Insecurity (12/02/2024)  Housing: Low Risk (12/02/2024)  Transportation Needs: No Transportation Needs (12/02/2024)  Utilities: Not At Risk (12/02/2024)  Social Connections: Moderately Isolated (12/02/2024)  Tobacco Use: High Risk (12/02/2024)     Readmission Risk Interventions     No data to display

## 2024-12-03 NOTE — Evaluation (Signed)
 Physical Therapy Evaluation Patient Details Name: Cynthia Shelton MRN: [id number] DOB: Oct 16, 1956 Today's Date: 12/03/2024  History of Present Illness  Cynthia Shelton is a 69 y.o. female with past medical history of prediabetes, remote history of DVT in 2012, iron deficiency anemia, history of tobacco use recently quit, she presents to ED on 07/14/2025 with chief complaint shortness of breath, cough.  Symptoms have been ongoing couple of weeks, initially seen in urgent care 11/20/2024 and treated for URI, prescribed doxycycline  as well as Xopenex  inhaler.   Clinical Impression  Patient received in bed, she reports she is coughing quite a bit. Agreeable to PT/OT assessment. Patient is pleasant and oriented. Received on room air with sats at 90-91%. She is mod I for bed mobility. Stands with supervision and ambulated about 30 feet without AD. Stopped to check O2 sats and they were 87%. Gave patient supplemental O2 at 2 liters and sats were maintained > 91% for the remainder of walk. She will continue to benefit from skilled PT to improve endurance and safety with mobility.           If plan is discharge home, recommend the following: A little help with walking and/or transfers;A little help with bathing/dressing/bathroom   Can travel by private vehicle    yes    Equipment Recommendations None recommended by PT  Recommendations for Other Services       Functional Status Assessment Patient has had a recent decline in their functional status and demonstrates the ability to make significant improvements in function in a reasonable and predictable amount of time.     Precautions / Restrictions Precautions Precautions: None Recall of Precautions/Restrictions: Intact Restrictions Weight Bearing Restrictions Per Provider Order: No      Mobility  Bed Mobility Overal bed mobility: Modified Independent                  Transfers Overall transfer level: Modified independent                       Ambulation/Gait Ambulation/Gait assistance: Supervision Gait Distance (Feet): 200 Feet Assistive device: None Gait Pattern/deviations: Step-through pattern, Decreased step length - right, Decreased step length - left, Decreased stride length Gait velocity: decr     General Gait Details: patient will reach out for rail or counter occasionally for support. 2 standing rest breaks to check O2 saturations.  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) Type of Home: House Home Access: Stairs to enter Entrance Stairs-Rails: Right Entrance Stairs-Number of Steps: several   Home Layout: One level Home Equipment: None      Prior Function Prior Level of Function : Independent/Modified Independent             Mobility Comments: no use of AD at baseline ADLs  Comments: independent     Extremity/Trunk Assessment   Upper Extremity Assessment Upper Extremity Assessment: Defer to OT evaluation    Lower Extremity Assessment Lower Extremity Assessment: Overall WFL for tasks assessed    Cervical / Trunk Assessment Cervical / Trunk Assessment: Normal  Communication   Communication Communication: No apparent difficulties    Cognition Arousal: Alert Behavior During Therapy: WFL for tasks assessed/performed   PT - Cognitive impairments: No apparent impairments                         Following commands: Intact        Cueing Cueing Techniques: Verbal cues     General Comments      Exercises     Assessment/Plan    PT Assessment Patient needs continued PT services  PT Problem List Cardiopulmonary status limiting activity;Decreased mobility;Decreased activity tolerance       PT Treatment Interventions Gait training;Stair training;Functional mobility training;Therapeutic activities;Therapeutic exercise;Patient/family education    PT Goals (Current goals can be found in the Care Plan section)  Acute Rehab PT Goals Patient Stated Goal: return home, feel better PT Goal Formulation: With patient Time For Goal Achievement: 12/10/24 Potential to Achieve Goals: Good    Frequency Min 2X/week     Co-evaluation               AM-PAC PT 6 Clicks Mobility  Outcome Measure Help needed turning from your back to your side while in a flat bed without using bedrails?: None Help needed moving from lying on your back to sitting on the side of a flat bed without using bedrails?: None Help needed moving to and from a bed to a chair (including a wheelchair)?: None Help needed standing up from a chair using your arms (e.g., wheelchair or bedside chair)?: None Help needed to walk in hospital room?: A Little Help needed climbing 3-5 steps with a railing? : A Little 6 Click Score: 22    End of Session Equipment Utilized During Treatment: Oxygen Activity Tolerance: Patient tolerated treatment well Patient left: in chair;with call bell/phone within reach;with chair alarm set Nurse Communication: Mobility status PT Visit Diagnosis: Other abnormalities of gait and mobility (R26.89);Difficulty in walking, not elsewhere classified (R26.2)    Time: 9064-9049 PT Time Calculation (min) (ACUTE ONLY): 15 min   Charges:   PT Evaluation $PT Eval Moderate Complexity: 1 Mod   PT General Charges $$ ACUTE PT VISIT: 1 Visit         Seif Teichert, PT, GCS 12/03/2024,10:05 AM

## 2024-12-03 NOTE — Consult Note (Addendum)
 PHARMACY - PHYSICIAN COMMUNICATION CRITICAL VALUE ALERT - BLOOD CULTURE IDENTIFICATION (BCID)  Cynthia Shelton is an 69 y.o. female who presented to [facility] on 12/02/2024 with a chief complaint of PNA  Assessment:  , BCID= staph epi 1/4 bottles mec a/c detected.   Name of physician (or Provider) Contacted: Dr. Lawence and Dr. Marsa  Current antibiotics: patient discharged on cefdinir  and azithromycin   Changes to prescribed antibiotics recommended:  Discussing with providers concern of infection vs contamination and potential to repeat cultures. Per Dr. Lawence, believes patient should ok but will defer Dr. Donita assessment on 1/10  Results for orders placed or performed during the hospital encounter of 12/02/24  Blood Culture ID Panel (Reflexed) (Collected: 12/02/2024 12:32 PM)  Result Value Ref Range   Enterococcus faecalis NOT DETECTED NOT DETECTED   Enterococcus Faecium NOT DETECTED NOT DETECTED   Listeria monocytogenes NOT DETECTED NOT DETECTED   Staphylococcus species DETECTED (A) NOT DETECTED   Staphylococcus aureus (BCID) NOT DETECTED NOT DETECTED   Staphylococcus epidermidis DETECTED (A) NOT DETECTED   Staphylococcus lugdunensis NOT DETECTED NOT DETECTED   Streptococcus species NOT DETECTED NOT DETECTED   Streptococcus agalactiae NOT DETECTED NOT DETECTED   Streptococcus pneumoniae NOT DETECTED NOT DETECTED   Streptococcus pyogenes NOT DETECTED NOT DETECTED   A.calcoaceticus-baumannii NOT DETECTED NOT DETECTED   Bacteroides fragilis NOT DETECTED NOT DETECTED   Enterobacterales NOT DETECTED NOT DETECTED   Enterobacter cloacae complex NOT DETECTED NOT DETECTED   Escherichia coli NOT DETECTED NOT DETECTED   Klebsiella aerogenes NOT DETECTED NOT DETECTED   Klebsiella oxytoca NOT DETECTED NOT DETECTED   Klebsiella pneumoniae NOT DETECTED NOT DETECTED   Proteus species NOT DETECTED NOT DETECTED   Salmonella species NOT DETECTED NOT DETECTED   Serratia marcescens NOT  DETECTED NOT DETECTED   Haemophilus influenzae NOT DETECTED NOT DETECTED   Neisseria meningitidis NOT DETECTED NOT DETECTED   Pseudomonas aeruginosa NOT DETECTED NOT DETECTED   Stenotrophomonas maltophilia NOT DETECTED NOT DETECTED   Candida albicans NOT DETECTED NOT DETECTED   Candida auris NOT DETECTED NOT DETECTED   Candida glabrata NOT DETECTED NOT DETECTED   Candida krusei NOT DETECTED NOT DETECTED   Candida parapsilosis NOT DETECTED NOT DETECTED   Candida tropicalis NOT DETECTED NOT DETECTED   Cryptococcus neoformans/gattii NOT DETECTED NOT DETECTED   Methicillin resistance mecA/C DETECTED (A) NOT DETECTED    Annabella LOISE Banks 12/03/2024  8:51 PM

## 2024-12-03 NOTE — Evaluation (Signed)
 Occupational Therapy Evaluation Patient Details Name: Cynthia Shelton MRN: [id number] DOB: March 14, 1956 Today's Date: 12/03/2024   History of Present Illness   Cynthia Shelton is a 69 y.o. female with past medical history of prediabetes, remote history of DVT in 2012, iron deficiency anemia, history of tobacco use recently quit, she presents to ED on 07/14/2025 with chief complaint shortness of breath, cough.  Symptoms have been ongoing couple of weeks, initially seen in urgent care 11/20/2024 and treated for URI, prescribed doxycycline  as well as Xopenex  inhaler.     Clinical Impressions Patient was seen for OT evaluation this date. Prior to hospital admission, patient was living in home with others (non specific- reports she helps a lot of people) and was independent. She reports none of those people will be able to assist her. She was on RA at start of eval, satting 90% at rest; ambulated 30 feet and O2 staying around 87-88%; 2L placed on patient, ambulated 150 feet and O2 remained above 90%. OT educated on energy conservation techniques to implement into ADL routine as well as O2 safety as she reports her room mates smoke, patient states understanding.  Patient presents with deficits in activity tolerance, affecting safe and optimal ADL completion. Patient is currently requiring increased time and effort to manage ADLs. Ambulating without AD.  Patient would benefit from skilled OT services to address noted impairments and functional limitations (see below for any additional details) in order to maximize safety and independence while minimizing future risk of falls, injury, and readmission. Anticipate the need for follow up OT services upon acute hospital DC.      If plan is discharge home, recommend the following:   Help with stairs or ramp for entrance     Functional Status Assessment   Patient has had a recent decline in their functional status and demonstrates the ability to make  significant improvements in function in a reasonable and predictable amount of time.     Equipment Recommendations   None recommended by OT     Recommendations for Other Services         Precautions/Restrictions   Precautions Precautions: None Recall of Precautions/Restrictions: Intact Restrictions Weight Bearing Restrictions Per Provider Order: No     Mobility Bed Mobility Overal bed mobility: Modified Independent                  Transfers Overall transfer level: Modified independent                        Balance Overall balance assessment: Mild deficits observed, not formally tested                                         ADL either performed or assessed with clinical judgement   ADL Overall ADL's : Modified independent                                       General ADL Comments: requires increased time and effort to manage ADLs due to poor activity tolerance     Vision         Perception         Praxis         Pertinent Vitals/Pain Pain Assessment Pain Assessment: No/denies pain  Extremity/Trunk Assessment Upper Extremity Assessment Upper Extremity Assessment: Overall WFL for tasks assessed   Lower Extremity Assessment Lower Extremity Assessment: Defer to PT evaluation   Cervical / Trunk Assessment Cervical / Trunk Assessment: Normal   Communication Communication Communication: No apparent difficulties   Cognition Arousal: Alert Behavior During Therapy: WFL for tasks assessed/performed Cognition: No apparent impairments             OT - Cognition Comments: tangential and sporadic with conversation, but aXo                 Following commands: Intact       Cueing  General Comments   Cueing Techniques: Verbal cues      Exercises     Shoulder Instructions      Home Living Family/patient expects to be discharged to:: Private residence Living Arrangements:  Non-relatives/Friends Available Help at Discharge: Other (Comment) (reports she has people that she helps but they are of no assist to her) Type of Home: House Home Access: Stairs to enter [employer] of Steps: several Entrance Stairs-Rails: Right Home Layout: One level     Bathroom Shower/Tub: Producer, Television/film/video: Standard     Home Equipment: None          Prior Functioning/Environment Prior Level of Function : Independent/Modified Independent             Mobility Comments: no use of AD at baseline ADLs Comments: independent    OT Problem List: Decreased activity tolerance   OT Treatment/Interventions: Self-care/ADL training;Therapeutic exercise;Energy conservation      OT Goals(Current goals can be found in the care plan section)   Acute Rehab OT Goals Patient Stated Goal: to go home OT Goal Formulation: With patient Time For Goal Achievement: 12/17/24 Potential to Achieve Goals: Good ADL Goals Pt Will Perform Grooming: Independently;standing Pt Will Perform Lower Body Dressing: Independently;sit to/from stand Pt Will Transfer to Toilet: Independently;regular height toilet Pt Will Perform Toileting - Clothing Manipulation and hygiene: Independently;sit to/from stand   OT Frequency:  Min 2X/week    Co-evaluation              AM-PAC OT 6 Clicks Daily Activity     Outcome Measure Help from another person eating meals?: None Help from another person taking care of personal grooming?: None Help from another person toileting, which includes using toliet, bedpan, or urinal?: None Help from another person bathing (including washing, rinsing, drying)?: None Help from another person to put on and taking off regular upper body clothing?: None Help from another person to put on and taking off regular lower body clothing?: None 6 Click Score: 24   End of Session Equipment Utilized During Treatment: Oxygen Nurse Communication:  Mobility status  Activity Tolerance: Patient tolerated treatment well Patient left: in chair;with call bell/phone within reach;with chair alarm set  OT Visit Diagnosis: Muscle weakness (generalized) (M62.81)                Time: 9064-9049 OT Time Calculation (min): 15 min Charges:  OT General Charges $OT Visit: 1 Visit OT Evaluation $OT Eval Low Complexity: 1 Low  Rogers Clause, OT/L MSOT, 12/03/2024

## 2024-12-03 NOTE — Discharge Instructions (Addendum)
 Treated for: COPD / pneumonia, possible asthma  Rx for: Oxygen at least temporarily - continuous use pending follow up with PCP Antibiotics to cover pneumonia  Inhalers - maintenance symbicort  + as needed levalbuterol  Note you have declined oral steroids due to previous reaction, prednisone has been added to your allergy/reaction list, but you have tolerated inhaled steroids ok  Follow up: See PCP ASAP  Repeat CT in about 6 weeks to follow up on lung nodule Symptoms and imaging fit w/ COPD, recommend lung function testing to confirm dx / severity  Recommend repeat oxygen assessment to determine if supplemental oxygen will be needed indefinitely  If worsening shortness of breath or other concerns please seek emergency medical care ASAP

## 2024-12-03 NOTE — Progress Notes (Signed)
 Initial Nutrition Assessment  DOCUMENTATION CODES:   Not applicable  INTERVENTION:   -MVI with minerals daily -Continue regular diet  NUTRITION DIAGNOSIS:   Increased nutrient needs related to acute illness as evidenced by estimated needs.  GOAL:   Patient will meet greater than or equal to 90% of their needs  MONITOR:   PO intake  REASON FOR ASSESSMENT:   Consult Assessment of nutrition requirement/status  ASSESSMENT:   69 y.o. female with past medical history of prediabetes, remote history of DVT in 2012, iron deficiency anemia, history of tobacco use admitted for COPD exacerbation/community-acquired pneumonia.  Patient admitted with CAP.   Spoke with patient at bedside, who was pleasant and in good spirits today. She reports feeling better, but admits being bored sitting her in the hospital. Patient reports good appetite and is eating 100% of meals.   Patient endorses a 100# weight loss over the past year. She reports that this has mainly been intentional and through lifestyle modifications. She has eliminated concentrated sweets, now drinks only water and sweet tea and has decreased the carbohydrate sin her diet, mainly focusing on protein and vegetables. Patient shares that she used to a be a vegetarian, but gained weight as she was consuming a lot of refined carbohydrates while on this diet, which she believes led to the weight gain. Reviewed CareEverywhere, patient was 158# on 06/26/23 encounter. No weight loss noted since that date.   Patient shares that she quit smoking 3 weeks ago and feels like she is having withdrawal symptoms which lead to hospitalization (had been smoking for 60 years). She reports decreased activity levels related to shortness of breath.   Discussed importance of good meal intake to promote healing. Patient had no nutrition-related questions at this time, but expressed appreciation for visit.   Medications reviewed and include solu-medrol   and zofran .   Labs reviewed.   NUTRITION - FOCUSED PHYSICAL EXAM:  Flowsheet Row Most Recent Value  Orbital Region Mild depletion  Upper Arm Region Moderate depletion  Thoracic and Lumbar Region No depletion  Buccal Region Mild depletion  Temple Region Mild depletion  Clavicle Bone Region No depletion  Clavicle and Acromion Bone Region No depletion  Scapular Bone Region No depletion  Dorsal Hand Mild depletion  Patellar Region No depletion  Anterior Thigh Region No depletion  Posterior Calf Region No depletion  Edema (RD Assessment) Mild  Hair Reviewed  Eyes Reviewed  Mouth Reviewed  Skin Reviewed  Nails Reviewed    Diet Order:   Diet Order             Diet regular Room service appropriate? Yes; Fluid consistency: Thin  Diet effective now                   EDUCATION NEEDS:   Education needs have been addressed  Skin:  Skin Assessment: Reviewed RN Assessment  Last BM:  12/02/24  Height:   Ht Readings from Last 1 Encounters:  12/02/24 5' 3 (1.6 m)    Weight:   Wt Readings from Last 1 Encounters:  12/02/24 73.9 kg    Ideal Body Weight:  52.3 kg  BMI:  Body mass index is 28.87 kg/m.  Estimated Nutritional Needs:   Kcal:  1850-2050  Protein:  90-105 grams  Fluid:  1.8-2.0 L    Margery ORN, RD, LDN, CDCES Registered Dietitian III Certified Diabetes Care and Education Specialist If unable to reach this RD, please use RD Inpatient group chat on secure chat between  hours of 8am-4 pm daily

## 2024-12-04 LAB — BLOOD GAS, VENOUS
Bicarbonate: 30.5 mmol/L — ABNORMAL HIGH (ref 20.0–28.0)
O2 Saturation: 47.4 % — AB (ref 0.0–2.0)
Patient temperature: 37
Patient temperature: 47.4 %
pCO2, Ven: 47 mmHg (ref 44–60)
pH, Ven: 7.42 (ref 7.25–7.43)
pO2, Ven: 31 mmHg — CL (ref 32–45)

## 2024-12-05 LAB — CULTURE, BLOOD (ROUTINE X 2)

## 2024-12-07 LAB — CULTURE, BLOOD (ROUTINE X 2)
Culture: NO GROWTH
Special Requests: ADEQUATE

## 2024-12-31 ENCOUNTER — Other Ambulatory Visit: Payer: Self-pay | Admitting: Family Medicine

## 2024-12-31 DIAGNOSIS — Z1231 Encounter for screening mammogram for malignant neoplasm of breast: Secondary | ICD-10-CM

## 2025-01-21 ENCOUNTER — Encounter
